# Patient Record
Sex: Female | Born: 1950 | Race: Black or African American | Hispanic: No | Marital: Single | Smoking: Never smoker
Health system: Southern US, Community
[De-identification: ages and names within clinical notes are randomized; demographics above are authoritative.]

## PROBLEM LIST (undated history)

## (undated) DIAGNOSIS — E119 Type 2 diabetes mellitus without complications: Secondary | ICD-10-CM

## (undated) DIAGNOSIS — S0990XA Unspecified injury of head, initial encounter: Secondary | ICD-10-CM

## (undated) DIAGNOSIS — I10 Essential (primary) hypertension: Secondary | ICD-10-CM

## (undated) HISTORY — PX: BRAIN SURGERY: SHX531

---
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
## 2004-09-28 ENCOUNTER — Ambulatory Visit: Payer: Self-pay | Admitting: Family Medicine

## 2004-10-05 ENCOUNTER — Ambulatory Visit: Payer: Self-pay | Admitting: Family Medicine

## 2004-12-22 ENCOUNTER — Emergency Department: Payer: Self-pay | Admitting: Emergency Medicine

## 2006-01-16 ENCOUNTER — Ambulatory Visit: Payer: Self-pay | Admitting: Family Medicine

## 2007-01-20 ENCOUNTER — Ambulatory Visit: Payer: Self-pay | Admitting: Family Medicine

## 2008-02-26 ENCOUNTER — Ambulatory Visit: Payer: Self-pay | Admitting: Family Medicine

## 2008-06-29 ENCOUNTER — Ambulatory Visit: Payer: Self-pay | Admitting: Gastroenterology

## 2009-02-28 ENCOUNTER — Ambulatory Visit: Payer: Self-pay | Admitting: Family Medicine

## 2010-04-12 ENCOUNTER — Ambulatory Visit: Payer: Self-pay | Admitting: Family Medicine

## 2011-05-23 ENCOUNTER — Ambulatory Visit: Payer: Self-pay | Admitting: Family Medicine

## 2012-06-30 ENCOUNTER — Ambulatory Visit: Payer: Self-pay | Admitting: Family Medicine

## 2013-07-01 ENCOUNTER — Ambulatory Visit: Payer: Self-pay | Admitting: Family Medicine

## 2013-10-30 ENCOUNTER — Emergency Department: Payer: Self-pay | Admitting: Emergency Medicine

## 2013-10-30 LAB — CBC
HCT: 36.3 % (ref 35.0–47.0)
HGB: 11.9 g/dL — ABNORMAL LOW (ref 12.0–16.0)
MCH: 29.4 pg (ref 26.0–34.0)
MCHC: 32.9 g/dL (ref 32.0–36.0)
MCV: 89 fL (ref 80–100)
PLATELETS: 297 10*3/uL (ref 150–440)
RBC: 4.06 10*6/uL (ref 3.80–5.20)
RDW: 13.6 % (ref 11.5–14.5)
WBC: 10 10*3/uL (ref 3.6–11.0)

## 2013-10-30 LAB — BASIC METABOLIC PANEL
Anion Gap: 8 (ref 7–16)
BUN: 13 mg/dL (ref 7–18)
Calcium, Total: 10.1 mg/dL (ref 8.5–10.1)
Chloride: 105 mmol/L (ref 98–107)
Co2: 28 mmol/L (ref 21–32)
Creatinine: 0.89 mg/dL (ref 0.60–1.30)
EGFR (African American): >60
Glucose: 106 mg/dL — ABNORMAL HIGH (ref 65–99)
OSMOLALITY: 282 (ref 275–301)
POTASSIUM: 3.2 mmol/L — AB (ref 3.5–5.1)
SODIUM: 141 mmol/L (ref 136–145)

## 2013-10-30 LAB — TROPONIN I

## 2014-02-14 ENCOUNTER — Emergency Department: Payer: Self-pay | Admitting: Emergency Medicine

## 2014-02-14 LAB — BASIC METABOLIC PANEL
ANION GAP: 13 (ref 7–16)
BUN: 16 mg/dL (ref 7–18)
CALCIUM: 9.4 mg/dL (ref 8.5–10.1)
Chloride: 105 mmol/L (ref 98–107)
Co2: 24 mmol/L (ref 21–32)
Creatinine: 1.06 mg/dL (ref 0.60–1.30)
EGFR (Non-African Amer.): 55 — ABNORMAL LOW
GLUCOSE: 142 mg/dL — AB (ref 65–99)
Osmolality: 287 (ref 275–301)
Potassium: 3.3 mmol/L — ABNORMAL LOW (ref 3.5–5.1)
SODIUM: 142 mmol/L (ref 136–145)

## 2014-02-14 LAB — CBC
HCT: 40.8 % (ref 35.0–47.0)
HGB: 12.9 g/dL (ref 12.0–16.0)
MCH: 28.7 pg (ref 26.0–34.0)
MCHC: 31.5 g/dL — AB (ref 32.0–36.0)
MCV: 91 fL (ref 80–100)
Platelet: 317 10*3/uL (ref 150–440)
RBC: 4.47 10*6/uL (ref 3.80–5.20)
RDW: 13.5 % (ref 11.5–14.5)
WBC: 8 10*3/uL (ref 3.6–11.0)

## 2014-02-14 LAB — TROPONIN I: Troponin-I: <0.02 ng/mL

## 2014-07-20 ENCOUNTER — Ambulatory Visit: Payer: Self-pay | Admitting: Family Medicine

## 2015-06-07 ENCOUNTER — Encounter: Payer: Self-pay | Admitting: Emergency Medicine

## 2015-06-07 ENCOUNTER — Emergency Department: Payer: Medicare HMO

## 2015-06-07 ENCOUNTER — Emergency Department
Admission: EM | Admit: 2015-06-07 | Discharge: 2015-06-07 | Disposition: A | Discharge status: Home / Self Care | Payer: Medicare HMO | Attending: Emergency Medicine | Admitting: Emergency Medicine

## 2015-06-07 DIAGNOSIS — S01112A Laceration without foreign body of left eyelid and periocular area, initial encounter: Secondary | ICD-10-CM | POA: Insufficient documentation

## 2015-06-07 DIAGNOSIS — Y9289 Other specified places as the place of occurrence of the external cause: Secondary | ICD-10-CM | POA: Diagnosis not present

## 2015-06-07 DIAGNOSIS — R270 Ataxia, unspecified: Secondary | ICD-10-CM | POA: Diagnosis not present

## 2015-06-07 DIAGNOSIS — S0181XA Laceration without foreign body of other part of head, initial encounter: Secondary | ICD-10-CM

## 2015-06-07 DIAGNOSIS — Z23 Encounter for immunization: Secondary | ICD-10-CM | POA: Insufficient documentation

## 2015-06-07 DIAGNOSIS — S0990XA Unspecified injury of head, initial encounter: Secondary | ICD-10-CM | POA: Diagnosis present

## 2015-06-07 DIAGNOSIS — Y998 Other external cause status: Secondary | ICD-10-CM | POA: Insufficient documentation

## 2015-06-07 DIAGNOSIS — Y9389 Activity, other specified: Secondary | ICD-10-CM | POA: Diagnosis not present

## 2015-06-07 DIAGNOSIS — W01198A Fall on same level from slipping, tripping and stumbling with subsequent striking against other object, initial encounter: Secondary | ICD-10-CM | POA: Insufficient documentation

## 2015-06-07 DIAGNOSIS — S0003XA Contusion of scalp, initial encounter: Secondary | ICD-10-CM

## 2015-06-07 HISTORY — DX: Unspecified injury of head, initial encounter: S09.90XA

## 2015-06-07 MED ORDER — TETANUS-DIPHTH-ACELL PERTUSSIS 5-2.5-18.5 LF-MCG/0.5 IM SUSP
0.5000 mL | Freq: Once | INTRAMUSCULAR | Status: AC
Start: 2015-06-07 — End: 2015-06-07
  Administered 2015-06-07: 0.5 mL via INTRAMUSCULAR
  Filled 2015-06-07 (×2): qty 0.5

## 2015-06-07 MED ORDER — LIDOCAINE-EPINEPHRINE (PF) 1 %-1:200000 IJ SOLN
INTRAMUSCULAR | Status: AC
  Administered 2015-06-07: 19:00:00
  Filled 2015-06-07: qty 30

## 2015-06-07 MED ORDER — LIDOCAINE-EPINEPHRINE (PF) 2 %-1:200000 IJ SOLN: 20.0000 mL | Freq: Once | INTRAMUSCULAR | Status: DC

## 2015-06-07 NOTE — Discharge Instructions (Signed)
Facial Laceration ° A facial laceration is a cut on the face. These injuries can be painful and cause bleeding. Lacerations usually heal quickly, but they need special care to reduce scarring. °DIAGNOSIS  °Your health care provider will take a medical history, ask for details about how the injury occurred, and examine the wound to determine how deep the cut is. °TREATMENT  °Some facial lacerations may not require closure. Others may not be able to be closed because of an increased risk of infection. The risk of infection and the chance for successful closure will depend on various factors, including the amount of time since the injury occurred. °The wound may be cleaned to help prevent infection. If closure is appropriate, pain medicines may be given if needed. Your health care provider will use stitches (sutures), wound glue (adhesive), or skin adhesive strips to repair the laceration. These tools bring the skin edges together to allow for faster healing and a better cosmetic outcome. If needed, you may also be given a tetanus shot. °HOME CARE INSTRUCTIONS °· Only take over-the-counter or prescription medicines as directed by your health care provider. °· Follow your health care provider's instructions for wound care. These instructions will vary depending on the technique used for closing the wound. °For Sutures: °· Keep the wound clean and dry.   °· If you were given a bandage (dressing), you should change it at least once a day. Also change the dressing if it becomes wet or dirty, or as directed by your health care provider.   °· Wash the wound with soap and water 2 times a day. Rinse the wound off with water to remove all soap. Pat the wound dry with a clean towel.   °· After cleaning, apply a thin layer of the antibiotic ointment recommended by your health care provider. This will help prevent infection and keep the dressing from sticking.   °· You may shower as usual after the first 24 hours. Do not soak the  wound in water until the sutures are removed.   °· Get your sutures removed as directed by your health care provider. With facial lacerations, sutures should usually be taken out after 4-5 days to avoid stitch marks.   °· Wait a few days after your sutures are removed before applying any makeup. °For Skin Adhesive Strips: °· Keep the wound clean and dry.   °· Do not get the skin adhesive strips wet. You may bathe carefully, using caution to keep the wound dry.   °· If the wound gets wet, pat it dry with a clean towel.   °· Skin adhesive strips will fall off on their own. You may trim the strips as the wound heals. Do not remove skin adhesive strips that are still stuck to the wound. They will fall off in time.   °For Wound Adhesive: °· You may briefly wet your wound in the shower or bath. Do not soak or scrub the wound. Do not swim. Avoid periods of heavy sweating until the skin adhesive has fallen off on its own. After showering or bathing, gently pat the wound dry with a clean towel.   °· Do not apply liquid medicine, cream medicine, ointment medicine, or makeup to your wound while the skin adhesive is in place. This may loosen the film before your wound is healed.   °· If a dressing is placed over the wound, be careful not to apply tape directly over the skin adhesive. This may cause the adhesive to be pulled off before the wound is healed.   °· Avoid   prolonged exposure to sunlight or tanning lamps while the skin adhesive is in place. °· The skin adhesive will usually remain in place for 5-10 days, then naturally fall off the skin. Do not pick at the adhesive film.   °After Healing: °Once the wound has healed, cover the wound with sunscreen during the day for 1 full year. This can help minimize scarring. Exposure to ultraviolet light in the first year will darken the scar. It can take 1-2 years for the scar to lose its redness and to heal completely.  °SEEK MEDICAL CARE IF: °· You have a fever. °SEEK IMMEDIATE  MEDICAL CARE IF: °· You have redness, pain, or swelling around the wound.   °· You see a yellowish-white fluid (pus) coming from the wound.   °  °This information is not intended to replace advice given to you by your health care provider. Make sure you discuss any questions you have with your health care provider. °  °Document Released: 05/24/2004 Document Revised: 05/07/2014 Document Reviewed: 11/27/2012 °Elsevier Interactive Patient Education ©2016 [employer] ° °Sutured Wound Care °Sutures are stitches that can be used to close wounds. Taking care of your wound properly can help to prevent pain and infection. It can also help your wound to heal more quickly. °HOW TO CARE FOR YOUR SUTURED WOUND °Wound Care °· Keep the wound clean and dry. °· If you were given a bandage (dressing), you should change it at least once per day or as directed by your health care provider. You should also change it if it becomes wet or dirty. °· Keep the wound completely dry for the first 24 hours or as directed by your health care provider. After that time, you may shower or bathe. However, make sure that the wound is not soaked in water until the sutures have been removed. °· Clean the wound one time each day or as directed by your health care provider. °¨ Wash the wound with soap and water. °¨ Rinse the wound with water to remove all soap. °¨ Pat the wound dry with a clean towel. Do not rub the wound. °· After cleaning the wound, apply a thin layer of antibiotic ointment as directed by your health care provider. This will help to prevent infection and keep the dressing from sticking to the wound. °· Have the sutures removed as directed by your health care provider. °General Instructions °· Take or apply medicines only as directed by your health care provider. °· To help prevent scarring, make sure to cover your wound with sunscreen whenever you are outside after the sutures are removed and the wound is healed. Make sure to wear a  sunscreen of at least 30 SPF. °· If you were prescribed an antibiotic medicine or ointment, finish all of it even if you start to feel better. °· Do not scratch or pick at the wound. °· Keep all follow-up visits as directed by your health care provider. This is important. °· Check your wound every day for signs of infection. Watch for:   °¨ Redness, swelling, or pain. °¨ Fluid, blood, or pus. °· Raise (elevate) the injured area above the level of your heart while you are sitting or lying down, if possible. °· Avoid stretching your wound. °· Drink enough fluids to keep your urine clear or pale yellow. °SEEK MEDICAL CARE IF: °· You received a tetanus shot and you have swelling, severe pain, redness, or bleeding at the injection site. °· You have a fever. °· A wound that was closed breaks open. °·   You notice a bad smell coming from the wound. °· You notice something coming out of the wound, such as wood or glass. °· Your pain is not controlled with medicine. °· You have increased redness, swelling, or pain at the site of your wound. °· You have fluid, blood, or pus coming from your wound. °· You notice a change in the color of your skin near your wound. °· You need to change the dressing frequently due to fluid, blood, or pus draining from the wound. °· You develop a new rash. °· You develop numbness around the wound. °SEEK IMMEDIATE MEDICAL CARE IF: °· You develop severe swelling around the injury site. °· Your pain suddenly increases and is severe. °· You develop painful lumps near the wound or on skin that is anywhere on your body. °· You have a red streak going away from your wound. °· The wound is on your hand or foot and you cannot properly move a finger or toe. °· The wound is on your hand or foot and you notice that your fingers or toes look pale or bluish. °  °This information is not intended to replace advice given to you by your health care provider. Make sure you discuss any questions you have with your  health care provider. °  °Document Released: 05/24/2004 Document Revised: 08/31/2014 Document Reviewed: 11/26/2012 °Elsevier Interactive Patient Education ©2016 [employer] ° °

## 2015-06-07 NOTE — ED Provider Notes (Signed)
[facility] Emergency Department Provider Note  ____________________________________________  Time seen: 7:00 PM  I have reviewed the triage vital signs and the nursing notes.   HISTORY  Chief Complaint Fall and Head Injury    HPI Ebony Guzman is a 65 y.o. female who is brought to the ED due to facial trauma. Due to prior head injury from when the patient was 16 the results of an craniectomy, the patient has chronic ataxia. There've been no change in her symptoms or recent illness, but she does normally have trouble walking and falls frequently. They when she fell, she had the left-sided face against a table causing lacerations and swelling of the left eye. Denies any eye pain or change in vision although she is not currently wearing her glasses. No other acute symptoms. No neck pain.     Past Medical History  Diagnosis Date  . Head injury      There are no active problems to display for this patient.    Past Surgical History  Procedure Laterality Date  . Brain surgery      previous head injury with ongoing deficits, patient was 16.  had craniectomy  . Craniectomy  1968     No current outpatient prescriptions on file.   Allergies Review of patient's allergies indicates no known allergies.   No family history on file.  Social History Social History  Substance Use Topics  . Smoking status: Never Smoker   . Smokeless tobacco: None  . Alcohol Use: No    Review of Systems  Constitutional:   No fever or chills. No weight changes Eyes:   No blurry vision or double vision.  ENT:   No sore throat. Cardiovascular:   No chest pain. Respiratory:   No dyspnea or cough. Gastrointestinal:   Negative for abdominal pain, vomiting and diarrhea.  No BRBPR or melena. Genitourinary:   Negative for dysuria, urinary retention, bloody urine, or difficulty urinating. Musculoskeletal:   Negative for back pain. No joint swelling or pain. Skin:    Negative for rash. Neurological:   Positive left forehead pain. No focal weakness or numbness. Chronic ataxia.Marland Kitchen Psychiatric:  No anxiety or depression.   Endocrine:  No hot/cold intolerance, changes in energy, or sleep difficulty.  10-point ROS otherwise negative.  ____________________________________________   PHYSICAL EXAM:  VITAL SIGNS: ED Triage Vitals  Enc Vitals Group     BP 06/07/15 1750 164/77 mmHg     Pulse Rate 06/07/15 1750 82     Resp 06/07/15 1750 18     Temp 06/07/15 1750 97.8 F (36.6 C)     Temp Source 06/07/15 1750 Oral     SpO2 06/07/15 1750 100 %     Weight 06/07/15 1750 205 lb (92.987 kg)     Height 06/07/15 1750  (1.626 m)     Head Cir --      Peak Flow --      Pain Score --      Pain Loc --      Pain Edu? --      Excl. in GC? --     Vital signs reviewed, nursing assessments reviewed.   Constitutional:   Alert and oriented. Well appearing and in no distress. Eyes:   No scleral icterus. No conjunctival pallor. PERRL. EOMI and painless ENT   Head:   Normocephalic with left forehead scalp hematoma. There is a 1.5 cm linear laceration through the left eyebrow and a 2 cm curvilinear laceration  through the left upper eyelid.. No debris or contamination   Nose:   No congestion/rhinnorhea. No septal hematoma   Mouth/Throat:   MMM, no pharyngeal erythema. No peritonsillar mass. No uvula shift.   Neck:   No stridor. No SubQ emphysema. No meningismus. No midline tenderness Hematological/Lymphatic/Immunilogical:   No cervical lymphadenopathy. Musculoskeletal:   Nontender with normal range of motion in all extremities. No joint effusions.  No lower extremity tenderness.  No edema. Neurologic:   Normal speech and language.  CN 2-10 normal. Motor grossly intact. No pronator drift.  Ataxic gait, at baseline according to family at the bedside who care for the patient daily.. No acute focal neurologic deficits are appreciated.  Skin:    Skin is  warm, dry and intact. No rash noted.  No petechiae, purpura, or bullae. Psychiatric:   Mood and affect are normal. Speech and behavior are normal. Patient exhibits appropriate insight and judgment.  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 No acute skull fracture or intracranial hemorrhage or other acute findings.  ____________________________________________   PROCEDURES LACERATION REPAIR Performed by: Sharman Cheek Authorized by: Sharman Cheek Consent: Verbal consent obtained. Risks and benefits: risks, benefits and alternatives were discussed Consent given by: patient Patient identity confirmed: provided demographic data Prepped and Draped in normal sterile fashion Wound explored  Laceration Location: Left eyebrow  Laceration Length: 1.5 cm  No Foreign Bodies seen or palpated  Anesthesia: local infiltration  Local anesthetic: lidocaine 1% with epinephrine  Anesthetic total: 1 ml  Irrigation method: syringe Amount of cleaning: standard  Skin closure: 4-0 Monocryl   Number of sutures: 2  Technique: Simple interrupted   Patient tolerance: Patient tolerated the procedure well with no immediate complications.    LACERATION REPAIR Performed by: Sharman Cheek Authorized by: Sharman Cheek Consent: Verbal consent obtained. Risks and benefits: risks, benefits and alternatives were discussed Consent given by: patient Patient identity confirmed: provided demographic data Prepped and Draped in normal sterile fashion Wound explored  Laceration Location: Left upper eyelid  Laceration Length: 2cm  No  Foreign Bodies seen or palpated  Anesthesia: local infiltration  Local anesthetic: lidocaine 1% with epinephrine  Anesthetic total: 1 ml  Irrigation method: syringe Amount of cleaning: standard  Skin closure: 5-0 Monocryl   Number of sutures: 2  Technique: Simple interrupted   Patient tolerance: Patient tolerated the procedure well with no immediate complications.   ____________________________________________   INITIAL IMPRESSION / ASSESSMENT AND PLAN / ED COURSE  Pertinent labs & imaging results that were available during my care of the patient were reviewed by me and considered in my medical decision making (see chart for details).  Patient presents with 2 small facial lacerations from blunt head trauma related to chronic ataxia. No other acute symptoms or findings. She is otherwise at her neurologic baseline. We'll discharge home with family members. Wounds cleaned and sutured for cosmesis and improved healing. Hemostatic without complications.     ____________________________________________   FINAL CLINICAL IMPRESSION(S) / ED DIAGNOSES  Final diagnoses:  Facial laceration, initial encounter  Scalp hematoma, initial encounter      Sharman Cheek, MD 06/07/15 [phone]

## 2015-06-07 NOTE — ED Notes (Signed)
Visual acuity was unable to be performed. Patient didn't bring her prescription glasses with her and her left eye is swollen closed.

## 2015-06-07 NOTE — ED Notes (Addendum)
Mechanical fall, lost balance and hit face on corner of table.  No blood thinners.    Laceration/hematoma to left brow, surgical scar (very old) from previous craniectomy from head trauma when she was 52.  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  Mechanical fall due to unsteady gait/ataxia r/t old head injury.  Takes 81 mg asa daily.  Has baseline confusion about date and situations.  Mother states patient can state her date of birth.

## 2015-06-10 ENCOUNTER — Other Ambulatory Visit: Payer: Self-pay | Admitting: Family Medicine

## 2015-06-10 DIAGNOSIS — R918 Other nonspecific abnormal finding of lung field: Secondary | ICD-10-CM

## 2015-06-20 ENCOUNTER — Ambulatory Visit: Admission: RE | Admit: 2015-06-20 | Payer: Medicare HMO | Source: Ambulatory Visit

## 2015-06-27 ENCOUNTER — Ambulatory Visit: Payer: Medicare HMO | Attending: Family Medicine

## 2016-04-30 ENCOUNTER — Emergency Department
Admission: EM | Admit: 2016-04-30 | Discharge: 2016-04-30 | Disposition: A | Discharge status: Home / Self Care | Payer: Medicare (Managed Care) | Attending: Emergency Medicine | Admitting: Emergency Medicine

## 2016-04-30 ENCOUNTER — Emergency Department: Payer: Medicare (Managed Care)

## 2016-04-30 ENCOUNTER — Encounter: Payer: Self-pay | Admitting: Emergency Medicine

## 2016-04-30 DIAGNOSIS — Y9389 Activity, other specified: Secondary | ICD-10-CM | POA: Diagnosis not present

## 2016-04-30 DIAGNOSIS — W108XXA Fall (on) (from) other stairs and steps, initial encounter: Secondary | ICD-10-CM | POA: Insufficient documentation

## 2016-04-30 DIAGNOSIS — S0003XA Contusion of scalp, initial encounter: Secondary | ICD-10-CM | POA: Insufficient documentation

## 2016-04-30 DIAGNOSIS — Y999 Unspecified external cause status: Secondary | ICD-10-CM | POA: Diagnosis not present

## 2016-04-30 DIAGNOSIS — S0990XA Unspecified injury of head, initial encounter: Secondary | ICD-10-CM

## 2016-04-30 DIAGNOSIS — Y929 Unspecified place or not applicable: Secondary | ICD-10-CM | POA: Diagnosis not present

## 2016-04-30 DIAGNOSIS — W19XXXA Unspecified fall, initial encounter: Secondary | ICD-10-CM

## 2016-04-30 NOTE — ED Provider Notes (Signed)
[facility] Emergency Department Provider Note  ____________________________________________   I have reviewed the triage vital signs and the nursing notes.   HISTORY  Chief Complaint Fall    HPI Ebony Guzman is a 66 y.o. 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 She is at her baseline today that she tripped while going on a carpeted stair and bumped her head. Did not pass out. No other symptoms before after. Feels back to baseline. No complaints of pain anywhere else. Has a hematoma to the occipital region.   Past Medical History:  Diagnosis Date  . Head injury     There are no active problems to display for this patient.   Past Surgical History:  Procedure Laterality Date  . BRAIN SURGERY     previous head injury with ongoing deficits, patient was 39.  had craniectomy  . CRANIECTOMY  1968    Prior to Admission medications   Not on File    Allergies Patient has no known allergies.  History reviewed. No pertinent family history.  Social History Social History  Substance Use Topics  . Smoking status: Never Smoker  . Smokeless tobacco: Never Used  . Alcohol use No    Review of Systems }Constitutional: No fever/chills Eyes: No visual changes. ENT: No sore throat. No stiff neck no neck pain Cardiovascular: Denies chest pain. Respiratory: Denies shortness of breath. Gastrointestinal:   no vomiting.  No diarrhea.  No constipation. Genitourinary: Negative for dysuria. Musculoskeletal: Negative lower extremity swelling Skin: Negative for rash. Neurological: Negative for severe headaches, focal weakness or numbness. 10-point ROS otherwise negative.  ____________________________________________   PHYSICAL EXAM:  VITAL SIGNS: ED Triage Vitals [04/30/16 1724]  Enc Vitals Group     BP      Pulse Rate 75     Resp      Temp 98.4 F (36.9 C)     Temp Source Oral      SpO2 100 %     Weight 199 lb (90.3 kg)     Height 5\' 5"  (1.651 m)     Head Circumference      Peak Flow      Pain Score      Pain Loc      Pain Edu?      Excl. in GC?     Constitutional: Alert and oriented to name and place, she knew the year,t. Well appearing and in no acute distress. Eyes: Conjunctivae are normal. PERRL. EOMI. Head: Hematoma with a slight abrasion noted to the occiput with no skull fracture palpated. Nose: No congestion/rhinnorhea. Mouth/Throat: Mucous membranes are moist.  Oropharynx non-erythematous. Neck: No stridor.   Nontender with no meningismus Cardiovascular: Normal rate, regular rhythm. Grossly normal heart sounds.  Good peripheral circulation. Respiratory: Normal respiratory effort.  No retractions. Lungs CTAB. Abdominal: Soft and nontender. No distention. No guarding no rebound Back:  There is no focal tenderness or step off.  there is no midline tenderness there are no lesions noted. there is no CVA tenderness Musculoskeletal: No lower extremity tenderness, no upper extremity tenderness. No joint effusions, no DVT signs strong distal pulses no edema Neurologic:  Normal speech and language. No gross focal neurologic deficits are appreciated.  Skin:  Skin is warm, dry and intact. No rash noted. Psychiatric: Mood and affect are normal. Speech and behavior are normal.  ____________________________________________   LABS (all labs ordered are listed, but  only abnormal results are displayed)  Labs Reviewed - No data to display ____________________________________________  EKG  I personally interpreted any EKGs ordered by me or triage  ____________________________________________  RADIOLOGY  I reviewed any imaging ordered by me or triage that were performed during my shift and, if possible, patient and/or family made aware of any abnormal findings. ____________________________________________   PROCEDURES  Procedure(s) performed:  None  Procedures  Critical Care performed: None  ____________________________________________   INITIAL IMPRESSION / ASSESSMENT AND PLAN / ED COURSE  Pertinent labs & imaging results that were available during my care of the patient were reviewed by me and considered in my medical decision making (see chart for details).  Patient at neurologic baseline CT shows no evidence of bleed or other significant injury, she has no evidence of neck or hip fracture at this time, we will discharge her home. This is a non-syncopal fall in a patient with poor balance was not using her walker. Family very comfortable with this. They do feel that she is at her baseline. Clinical Course    ____________________________________________   FINAL CLINICAL IMPRESSION(S) / ED DIAGNOSES  Final diagnoses:  None      This chart was dictated using voice recognition software.  Despite best efforts to proofread,  errors can occur which can change meaning.      Jeanmarie PlantJames A Gaetano Romberger, MD 04/30/16 [phone]

## 2016-04-30 NOTE — ED Triage Notes (Addendum)
Pt ems from home s/p fall. Pt tripped and fell down  concrete ramp. Pt has small non-bleeding lac posterior head. 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 Per ems, family stated that pt is at baseline. Per pt she is at baseline. Pt denies pain.

## 2016-09-14 ENCOUNTER — Other Ambulatory Visit: Payer: Self-pay | Admitting: Family

## 2016-09-14 DIAGNOSIS — R918 Other nonspecific abnormal finding of lung field: Secondary | ICD-10-CM

## 2016-10-04 ENCOUNTER — Other Ambulatory Visit: Payer: Self-pay | Admitting: Family

## 2016-10-04 DIAGNOSIS — Z1231 Encounter for screening mammogram for malignant neoplasm of breast: Secondary | ICD-10-CM

## 2016-10-05 ENCOUNTER — Ambulatory Visit
Admission: RE | Admit: 2016-10-05 | Discharge: 2016-10-05 | Disposition: A | Discharge status: Home / Self Care | Payer: Medicare (Managed Care) | Source: Ambulatory Visit | Attending: Family | Admitting: Family

## 2016-10-05 DIAGNOSIS — R918 Other nonspecific abnormal finding of lung field: Secondary | ICD-10-CM | POA: Diagnosis not present

## 2016-11-14 ENCOUNTER — Ambulatory Visit
Admission: RE | Admit: 2016-11-14 | Discharge: 2016-11-14 | Disposition: A | Discharge status: Home / Self Care | Payer: Medicare (Managed Care) | Source: Ambulatory Visit | Attending: Family | Admitting: Family

## 2016-11-14 DIAGNOSIS — Z1231 Encounter for screening mammogram for malignant neoplasm of breast: Secondary | ICD-10-CM | POA: Diagnosis present

## 2019-04-03 ENCOUNTER — Emergency Department: Payer: Medicare (Managed Care)

## 2019-04-03 ENCOUNTER — Other Ambulatory Visit: Payer: Self-pay

## 2019-04-03 ENCOUNTER — Inpatient Hospital Stay
Admission: EM | Admit: 2019-04-03 | Discharge: 2019-04-05 | DRG: 542 | Disposition: A | Discharge status: Home Health | Payer: Medicare (Managed Care) | Attending: Internal Medicine | Admitting: Internal Medicine

## 2019-04-03 DIAGNOSIS — J1282 Pneumonia due to coronavirus disease 2019: Secondary | ICD-10-CM

## 2019-04-03 DIAGNOSIS — R778 Other specified abnormalities of plasma proteins: Secondary | ICD-10-CM | POA: Diagnosis present

## 2019-04-03 DIAGNOSIS — I10 Essential (primary) hypertension: Secondary | ICD-10-CM

## 2019-04-03 DIAGNOSIS — E119 Type 2 diabetes mellitus without complications: Secondary | ICD-10-CM

## 2019-04-03 DIAGNOSIS — W1830XA Fall on same level, unspecified, initial encounter: Secondary | ICD-10-CM | POA: Diagnosis present

## 2019-04-03 DIAGNOSIS — S069X9S Unspecified intracranial injury with loss of consciousness of unspecified duration, sequela: Secondary | ICD-10-CM

## 2019-04-03 DIAGNOSIS — S32591A Other specified fracture of right pubis, initial encounter for closed fracture: Secondary | ICD-10-CM | POA: Diagnosis not present

## 2019-04-03 DIAGNOSIS — Z803 Family history of malignant neoplasm of breast: Secondary | ICD-10-CM

## 2019-04-03 DIAGNOSIS — S069X9A Unspecified intracranial injury with loss of consciousness of unspecified duration, initial encounter: Secondary | ICD-10-CM | POA: Diagnosis present

## 2019-04-03 DIAGNOSIS — U071 COVID-19: Secondary | ICD-10-CM

## 2019-04-03 DIAGNOSIS — S329XXA Fracture of unspecified parts of lumbosacral spine and pelvis, initial encounter for closed fracture: Secondary | ICD-10-CM | POA: Diagnosis present

## 2019-04-03 DIAGNOSIS — Z23 Encounter for immunization: Secondary | ICD-10-CM

## 2019-04-03 DIAGNOSIS — J1289 Other viral pneumonia: Secondary | ICD-10-CM

## 2019-04-03 DIAGNOSIS — M84454A Pathological fracture, pelvis, initial encounter for fracture: Principal | ICD-10-CM | POA: Diagnosis present

## 2019-04-03 DIAGNOSIS — Y92019 Unspecified place in single-family (private) house as the place of occurrence of the external cause: Secondary | ICD-10-CM

## 2019-04-03 DIAGNOSIS — Z87820 Personal history of traumatic brain injury: Secondary | ICD-10-CM

## 2019-04-03 DIAGNOSIS — Z801 Family history of malignant neoplasm of trachea, bronchus and lung: Secondary | ICD-10-CM

## 2019-04-03 DIAGNOSIS — S069XAA Unspecified intracranial injury with loss of consciousness status unknown, initial encounter: Secondary | ICD-10-CM | POA: Diagnosis present

## 2019-04-03 HISTORY — DX: Essential (primary) hypertension: I10

## 2019-04-03 HISTORY — DX: Type 2 diabetes mellitus without complications: E11.9

## 2019-04-03 LAB — CBC WITH DIFFERENTIAL/PLATELET
Abs Immature Granulocytes: 0.03 10*3/uL (ref 0.00–0.07)
Basophils Absolute: 0 10*3/uL (ref 0.0–0.1)
Basophils Relative: 0 %
Eosinophils Absolute: 0 10*3/uL (ref 0.0–0.5)
Eosinophils Relative: 0 %
HCT: 35.4 % — ABNORMAL LOW (ref 36.0–46.0)
Hemoglobin: 11.9 g/dL — ABNORMAL LOW (ref 12.0–15.0)
Immature Granulocytes: 1 %
Lymphocytes Relative: 18 %
Lymphs Abs: 1.2 10*3/uL (ref 0.7–4.0)
MCH: 29.2 pg (ref 26.0–34.0)
MCHC: 33.6 g/dL (ref 30.0–36.0)
MCV: 86.8 fL (ref 80.0–100.0)
Monocytes Absolute: 0.3 10*3/uL (ref 0.1–1.0)
Monocytes Relative: 4 %
Neutro Abs: 5.1 10*3/uL (ref 1.7–7.7)
Neutrophils Relative %: 77 %
Platelets: 193 10*3/uL (ref 150–400)
RBC: 4.08 MIL/uL (ref 3.87–5.11)
RDW: 13.5 % (ref 11.5–15.5)
Smear Review: NORMAL
WBC: 6.6 10*3/uL (ref 4.0–10.5)
nRBC: 0 % (ref 0.0–0.2)

## 2019-04-03 LAB — URINALYSIS, COMPLETE (UACMP) WITH MICROSCOPIC
Bacteria, UA: NONE SEEN
Bilirubin Urine: NEGATIVE
Glucose, UA: NEGATIVE mg/dL
Hgb urine dipstick: NEGATIVE
Ketones, ur: 5 mg/dL — AB
Leukocytes,Ua: NEGATIVE
Nitrite: NEGATIVE
Protein, ur: 100 mg/dL — AB
Specific Gravity, Urine: 1.018 (ref 1.005–1.030)
pH: 6 (ref 5.0–8.0)

## 2019-04-03 LAB — COMPREHENSIVE METABOLIC PANEL
ALT: 28 U/L (ref 0–44)
AST: 76 U/L — ABNORMAL HIGH (ref 15–41)
Albumin: 3.3 g/dL — ABNORMAL LOW (ref 3.5–5.0)
Alkaline Phosphatase: 36 U/L — ABNORMAL LOW (ref 38–126)
Anion gap: 10 (ref 5–15)
BUN: 12 mg/dL (ref 8–23)
CO2: 24 mmol/L (ref 22–32)
Calcium: 8.6 mg/dL — ABNORMAL LOW (ref 8.9–10.3)
Chloride: 102 mmol/L (ref 98–111)
Creatinine, Ser: 0.8 mg/dL (ref 0.44–1.00)
GFR calc Af Amer: >60 mL/min (ref >60)
GFR calc non Af Amer: >60 mL/min (ref >60)
Glucose, Bld: 137 mg/dL — ABNORMAL HIGH (ref 70–99)
Potassium: 3.8 mmol/L (ref 3.5–5.1)
Sodium: 136 mmol/L (ref 135–145)
Total Bilirubin: 0.4 mg/dL (ref 0.3–1.2)
Total Protein: 7.2 g/dL (ref 6.5–8.1)

## 2019-04-03 LAB — TROPONIN I (HIGH SENSITIVITY)
Troponin I (High Sensitivity): 86 ng/L — ABNORMAL HIGH (ref <18)
Troponin I (High Sensitivity): 67 ng/L — ABNORMAL HIGH (ref <18)

## 2019-04-03 LAB — PROCALCITONIN: Procalcitonin: <0.1 ng/mL

## 2019-04-03 MED ORDER — ENOXAPARIN SODIUM 40 MG/0.4ML ~~LOC~~ SOLN
40.0000 mg | SUBCUTANEOUS | Status: DC
  Administered 2019-04-03 – 2019-04-04 (×2): 40 mg via SUBCUTANEOUS
  Filled 2019-04-03 (×2): qty 0.4

## 2019-04-03 MED ORDER — ACETAMINOPHEN 500 MG PO TABS
1000.0000 mg | ORAL_TABLET | Freq: Three times a day (TID) | ORAL | Status: DC
  Administered 2019-04-03 – 2019-04-05 (×6): 1000 mg via ORAL
  Filled 2019-04-03 (×7): qty 2

## 2019-04-03 MED ORDER — HYDROCOD POLST-CPM POLST ER 10-8 MG/5ML PO SUER: 5.0000 mL | Freq: Two times a day (BID) | ORAL | Status: DC | PRN

## 2019-04-03 MED ORDER — VITAMIN C 500 MG PO TABS
500.0000 mg | ORAL_TABLET | Freq: Every day | ORAL | Status: DC
  Administered 2019-04-03 – 2019-04-05 (×3): 500 mg via ORAL
  Filled 2019-04-03 (×3): qty 1

## 2019-04-03 MED ORDER — GUAIFENESIN-DM 100-10 MG/5ML PO SYRP
10.0000 mL | ORAL_SOLUTION | ORAL | Status: DC | PRN
  Filled 2019-04-03: qty 10

## 2019-04-03 MED ORDER — KETOROLAC TROMETHAMINE 15 MG/ML IJ SOLN
15.0000 mg | Freq: Once | INTRAMUSCULAR | Status: AC
  Administered 2019-04-03: 15 mg via INTRAVENOUS
  Filled 2019-04-03: qty 1

## 2019-04-03 MED ORDER — ACETAMINOPHEN 325 MG PO TABS
650.0000 mg | ORAL_TABLET | Freq: Once | ORAL | Status: AC | PRN
  Administered 2019-04-03: 650 mg via ORAL
  Filled 2019-04-03: qty 2

## 2019-04-03 MED ORDER — OXYCODONE HCL 5 MG PO TABS: 5.0000 mg | ORAL_TABLET | ORAL | Status: DC | PRN

## 2019-04-03 MED ORDER — HYDROCODONE-ACETAMINOPHEN 5-325 MG PO TABS
1.0000 | ORAL_TABLET | ORAL | Status: DC | PRN
  Administered 2019-04-03: 1 via ORAL
  Filled 2019-04-03: qty 1

## 2019-04-03 MED ORDER — ACETAMINOPHEN 500 MG PO TABS: 1000.0000 mg | ORAL_TABLET | Freq: Once | ORAL | Status: DC

## 2019-04-03 MED ORDER — ZINC SULFATE 220 (50 ZN) MG PO CAPS
220.0000 mg | ORAL_CAPSULE | Freq: Every day | ORAL | Status: DC
  Administered 2019-04-03 – 2019-04-05 (×3): 220 mg via ORAL
  Filled 2019-04-03 (×4): qty 1

## 2019-04-03 NOTE — H&P (Signed)
History and Physical  Patient Name: Ebony Guzman     TFT:[id number]    DOB: 07/14/1950    DOA: 04/03/2019 PCP: Talbert Cage, FNP  Patient coming from: Home  Chief Complaint: Pelvic pain      HPI: Ebony Guzman is a 68 y.o. F with hx HTN, DM and remote TBI in [facility] age 20 who presents with fall and leg pain.  Patient was in [place] until Monday 5 days ago, developed coryza.  Tested positive for COVID, and was home self-monitoring until this morning, she was walking and had mechanical fall.  After the fall, she was unable to bear weight and had severe pain in the right groin and right leg.  Two people could not lift her to stand.  In the ER, febrile to 101F, HR and RR and SpO2 normal.  CXR showed patchy multifocal pneumonia.  Hip radiograph normal but CT pelvis showed faint inferior pubic ramus fracture, consistent with pain.  Orthopedics recommended pain control and WBAT.           ROS: Review of Systems  Constitutional: Positive for fever. Negative for malaise/fatigue.  HENT: Positive for congestion.   Respiratory: Negative for cough, hemoptysis, sputum production, shortness of breath and wheezing.   Cardiovascular: Negative for chest pain.  Neurological: Positive for weakness and headaches. Negative for dizziness, tingling, tremors, sensory change, speech change, focal weakness, seizures and loss of consciousness.  All other systems reviewed and are negative.         Past Medical History:  Diagnosis Date   Diabetes mellitus without complication ([place])    Head injury    Hypertension     Past Surgical History:  Procedure Laterality Date   BRAIN SURGERY     previous head injury with ongoing deficits, patient was 17.  had craniectomy   CRANIECTOMY  1968    Social History: Patient lives with her niece.  The patient walks with a walker for many years.  Nonsmoker.  No Known Allergies  Family history: family history includes Breast cancer in her  mother; Lung cancer in her father.  Prior to Admission medications   Not on File  Amlodipine lisinopril Crestor      Physical Exam: BP (!) 145/84    Pulse 92    Temp (!) 101.6 F (38.7 C) (Oral)    Resp 16    Ht 5\' 8"  (1.727 m)    Wt 99.8 kg    LMP  (LMP Unknown)    SpO2 99%    BMI 33.45 kg/m  General appearance: Well-developed, eldelry adult female, alert and in no acute distress.   Eyes: Anicteric, conjunctiva pink, lids and lashes normal. PERRL.    ENT: No nasal deformity, discharge, epistaxis.  Hearing normal. OP moist without lesions.   Skin: Warm and dry.  No jaundice.  No suspicious rashes or lesions. Cardiac: RRR, nl S1-S2, no murmurs appreciated.  Capillary refill is brisk.  No JVD.  No LE edema.  Radial pulses 2+ and symmetric. Respiratory: Normal respiratory rate and rhythm.  CTAB without rales or wheezes. Abdomen: Abdomen soft.  No TTP or gaurding. No ascites, distension, hepatosplenomegaly.   MSK: No deformities or effusions of the large joints of the upper or lower extremities bilaterally.  No cyanosis or clubbing. Neuro: Cranial nerves 3-12 intact.  Sensation intact to light touch. Speech is fluent.  Muscle strength 5/5 in arms, slightly spastic bilaterally, legs limited by pain, ankles trength 5/5, pain prevented flexion at hip.  Stutter noted.  Psych: Sensorium intact and responding to questions, attention normal.  Behavior appropriate.  Affect normal.  Judgment and insight appear normal.     Labs on Admission:  I have personally reviewed following labs and imaging studies: CBC: Recent Labs  Lab 04/03/19 1032  WBC 6.6  NEUTROABS 5.1  HGB 11.9*  HCT 35.4*  MCV 86.8  PLT 193   Basic Metabolic Panel: Recent Labs  Lab 04/03/19 1032  NA 136  K 3.8  CL 102  CO2 24  GLUCOSE 137*  BUN 12  CREATININE 0.80  CALCIUM 8.6*   GFR: Estimated Creatinine Clearance: 80.9 mL/min (by C-G formula based on SCr of 0.8 mg/dL).  Liver Function Tests: Recent Labs    Lab 04/03/19 1032  AST 76*  ALT 28  ALKPHOS 36*  BILITOT 0.4  PROT 7.2  ALBUMIN 3.3*          Radiological Exams on Admission: Personally reviewed CXR showed no focal opacity; CT head and c-spine negative, CT pelvis report reviewed: Ct Head Wo Contrast  Result Date: 04/03/2019 CLINICAL DATA:  Unwitnessed trauma this morning status post fall. EXAM: CT HEAD WITHOUT CONTRAST CT CERVICAL SPINE WITHOUT CONTRAST TECHNIQUE: Multidetector CT imaging of the head and cervical spine was performed following the standard protocol without intravenous contrast. Multiplanar CT image reconstructions of the cervical spine were also generated. COMPARISON:  Head CT April 30, 2016 FINDINGS: CT HEAD FINDINGS Brain: No acute territory infarction or intracranial hemorrhage is visualized. No focal mass lesion is identified. Asymmetric left greater than right lateral ex vacuole ventricular enlargement is similar to prior exam. Left hemispheric encephalomalacia and white matter volume loss is similar to prior exam. Stable small old lacunar infarcts are identified in the right periventricular white matter unchanged. Vascular: No hyperdense vessel is noted. Skull: No acute abnormality. Sinuses/Orbits: No acute finding. Other: None. CT CERVICAL SPINE FINDINGS Alignment: Normal. Skull base and vertebrae: No acute fracture. No focal pathologic process is noted. Soft tissues and spinal canal: No prevertebral fluid or swelling. No visible canal hematoma. Disc levels: Extensive anterior osteophytosis is identified throughout the cervical spine. The intervertebral spaces are slightly diminished. Upper chest: Visualized lung apices are clear. Mild generalized enlargement of the thyroid gland is noted. Other: None. IMPRESSION: 1. No focal acute intracranial abnormality identified. 2. No acute fracture or dislocation of cervical spine. Electronically Signed   By: Sherian ReinWei-Chen  Lin M.D.   On: 04/03/2019 10:20   Ct Cervical Spine Wo  Contrast  Result Date: 04/03/2019 CLINICAL DATA:  Unwitnessed trauma this morning status post fall. EXAM: CT HEAD WITHOUT CONTRAST CT CERVICAL SPINE WITHOUT CONTRAST TECHNIQUE: Multidetector CT imaging of the head and cervical spine was performed following the standard protocol without intravenous contrast. Multiplanar CT image reconstructions of the cervical spine were also generated. COMPARISON:  Head CT April 30, 2016 FINDINGS: CT HEAD FINDINGS Brain: No acute territory infarction or intracranial hemorrhage is visualized. No focal mass lesion is identified. Asymmetric left greater than right lateral ex vacuole ventricular enlargement is similar to prior exam. Left hemispheric encephalomalacia and white matter volume loss is similar to prior exam. Stable small old lacunar infarcts are identified in the right periventricular white matter unchanged. Vascular: No hyperdense vessel is noted. Skull: No acute abnormality. Sinuses/Orbits: No acute finding. Other: None. CT CERVICAL SPINE FINDINGS Alignment: Normal. Skull base and vertebrae: No acute fracture. No focal pathologic process is noted. Soft tissues and spinal canal: No prevertebral fluid or swelling. No visible canal hematoma. Disc  levels: Extensive anterior osteophytosis is identified throughout the cervical spine. The intervertebral spaces are slightly diminished. Upper chest: Visualized lung apices are clear. Mild generalized enlargement of the thyroid gland is noted. Other: None. IMPRESSION: 1. No focal acute intracranial abnormality identified. 2. No acute fracture or dislocation of cervical spine. Electronically Signed   By: Sherian Rein M.D.   On: 04/03/2019 10:20   Ct Pelvis Wo Contrast  Result Date: 04/03/2019 CLINICAL DATA:  Right hip pain after fall EXAM: CT PELVIS WITHOUT CONTRAST TECHNIQUE: Multidetector CT imaging of the pelvis was performed following the standard protocol without intravenous contrast. COMPARISON:  x-ray 04/03/2019  FINDINGS: Bones/Joint/Cartilage Very subtle cortical lucency of the medial aspect of the right inferior pubic ramus (series 2, image 91) which could represent a prominent vascular channel versus a nondisplaced fracture. The osseous structures appear otherwise intact. No additional fractures. SI joints and pubic symphysis are intact without diastasis. Bilateral hips are intact without fracture or dislocation. Right greater than left facet arthropathy noted at the L4-5 level. Assimilation joint on the right at L5-S1. No demonstrable hip joint effusion. Ligaments Suboptimally evaluated by CT. Muscles and Tendons Tendinous structures about the pelvis appear grossly intact. No focal muscular abnormality. No right adductor muscle group hematoma. Soft tissues No soft tissue fluid collection or hematoma. No acute intrapelvic abnormality. IMPRESSION: 1. Very subtle cortical lucency of the medial aspect of the right inferior pubic ramus which could represent a prominent vascular channel versus a nondisplaced fracture. The osseous structures appear otherwise intact. 2. No acute findings within the pelvis or surrounding soft tissues. Electronically Signed   By: Duanne Guess M.D.   On: 04/03/2019 14:46   Dg Chest Portable 1 View  Result Date: 04/03/2019 CLINICAL DATA:  COVID-19 positive.  Fall this morning. EXAM: PORTABLE CHEST 1 VIEW COMPARISON:  10/31/2013 chest radiograph. FINDINGS: Stable cardiomediastinal silhouette with normal heart size. No pneumothorax. No pleural effusion. Patchy hazy opacities in the peripheral upper and lower right lung, new. No displaced fractures in the visualized chest. IMPRESSION: New patchy hazy opacities in the peripheral upper lower right lung compatible with multilobar pneumonia. Chest radiograph follow-up advised. Electronically Signed   By: Delbert Phenix M.D.   On: 04/03/2019 10:21   Dg Hip Unilat  With Pelvis 2-3 Views Right  Result Date: 04/03/2019 CLINICAL DATA:  Larey Seat this  morning.  Right hip pain. EXAM: DG HIP (WITH OR WITHOUT PELVIS) 2-3V RIGHT COMPARISON:  None. FINDINGS: Both hips are normally located. No acute hip fracture. The pubic symphysis and SI joints are intact. Suspect right-sided pubic rami fractures. No other pelvic bone fractures. IMPRESSION: No acute right hip fracture. Suspect right-sided pubic bone fractures. Electronically Signed   By: Rudie Meyer M.D.   On: 04/03/2019 09:40    EKG: Independently reviewed. Rate normal, sinus rhythm, diffuse TW flattening.       Assessment/Plan   Pelvic insufficiency fracture  -PT eval -Toradol once  -Start scheduled acetaminophen -Start oxycodone PRN for severe pain   COVID-19 pneumonitis without hypoxic respiratory failure  At present, patient has pneumonitis on CXR, but SpO2 99% on room air, and RR 14 and is minimally symptomatic (fever, fatigue, but no cough, dyspnea) -q8hrs SpO2 -If SpO2 worsening or symptoms worsen (she will be day 7 of symptoms on Monday) will start remdesivir, but at present, I feel the benefit of this is low -Reasonable to start Zinc, vitamin C    Hypertension  -Continue antihypertensives, Crestor when med rec reviewed  Remote TBI  DVT prophylaxis: Lovenox  Code Status: FULL  Family Communication: Niece and POA at bedside, CODE STATUS confirmed  Disposition Plan: Anticipate observation overnight.  PT eval tomorrow.  If able to walk/transfer tomorrow, then home.  Ebony Salts, NP from [facility] will be able to arrange lift, wheelchair, PT/OT, and hospital bed on Monday.    If unable to stand Monday, will likely need transfer to [facility] and dischareg to home on Monday with PACE support.    Consults called: None Admission status: OBS   At the point of initial evaluation, it is my clinical opinion that admission for OBSERVATION is reasonable and necessary because the patient's presenting complaints in the context of their chronic conditions represent  sufficient risk of deterioration or significant morbidity to constitute reasonable grounds for close observation in the hospital setting, but that the patient may be medically stable for discharge from the hospital within 24 to 48 hours.    Medical decision making: Patient seen at 4:43 PM on 04/03/2019.  The patient was discussed with Dr. Larinda Buttery.  What exists of the patient's chart was reviewed in depth and summarized above.  Clinical condition: stable.        Ebony Guzman [facility] Please page though AMION or Epic secure chat:  For password, contact charge nurse    (current NIH and FDA guidelines recommend no difference in use of NSAIDs in patients with COVID)

## 2019-04-03 NOTE — ED Triage Notes (Addendum)
Pt comes into the ED via EMS from home with c/o pt fall this morning ,niece/POA states she hear a boom and found the pt on the floor , but seems to be favoring her right leg, able to ambulate with assist, per EMS. Pt does complain of pain with palpation of the right hip. Pt is covid +

## 2019-04-03 NOTE — ED Notes (Addendum)
Patient transported to X-ray 

## 2019-04-03 NOTE — ED Notes (Signed)
Patient transported to CT 

## 2019-04-03 NOTE — ED Provider Notes (Addendum)
[facility] Emergency Department Provider Note   ____________________________________________   First MD Initiated Contact with Patient 04/03/19 0930     (approximate)  I have reviewed the triage vital signs and the nursing notes.   HISTORY  Chief Complaint Fall   HPI Ebony Guzman is a 68 y.o. female resents to the ED via EMS from home after patient fell this morning.  Patient states that she stumbled.  And fell.  She denies any head injury or loss of consciousness.  Family member say that there was no one in the room but they did hear her fall.  She has not been able to bear weight on her right leg according to family members.  Patient was told yesterday that she is Covid positive.  She was tested due to a grandson being positive for Covid who lives at the home.  Her symptoms were runny nose without cough.  Patient denies any shortness of breath or difficulty breathing.  She presents to the ED with a temp of 101.6.  Patient denies any other injuries.         Past Medical History:  Diagnosis Date   Head injury     There are no active problems to display for this patient.   Past Surgical History:  Procedure Laterality Date   BRAIN SURGERY     previous head injury with ongoing deficits, patient was 1.  had craniectomy   CRANIECTOMY  1968    Prior to Admission medications   Not on File    Allergies Patient has no known allergies.  No family history on file.  Social History Social History   Tobacco Use   Smoking status: Never Smoker   Smokeless tobacco: Never Used  Substance Use Topics   Alcohol use: No   Drug use: Not on file    Review of Systems Constitutional: Positive fever/chills Eyes: No visual changes. ENT: No trauma. Cardiovascular: Denies chest pain. Respiratory: Denies shortness of breath.  Denies cough. Gastrointestinal: No abdominal pain.  No nausea, no vomiting.  No diarrhea.  Genitourinary: Negative  for dysuria. Musculoskeletal: Negative for back pain.  Positive for right hip pain. Skin: Negative for rash. Neurological: Negative for headaches, focal weakness or numbness. ____________________________________________   PHYSICAL EXAM:  VITAL SIGNS: ED Triage Vitals  Enc Vitals Group     BP 04/03/19 0857 137/82     Pulse Rate 04/03/19 0857 87     Resp 04/03/19 0857 18     Temp 04/03/19 0857 (!) 101.6 F (38.7 C)     Temp Source 04/03/19 0857 Oral     SpO2 04/03/19 0857 97 %     Weight 04/03/19 0851 220 lb (99.8 kg)     Height 04/03/19 0851  (1.727 m)     Head Circumference --      Peak Flow --      Pain Score --      Pain Loc --      Pain Edu? --      Excl. in GC? --    Constitutional: Alert and oriented. Well appearing and in no acute distress.  Patient is able to answer questions but does have a stuttering problem. Eyes: Conjunctivae are normal. PERRL. EOMI. Head: Atraumatic. Nose: No congestion/rhinnorhea. Neck: No stridor.  No tenderness on palpation of cervical spine posteriorly. Cardiovascular: Normal rate, regular rhythm. Grossly normal heart sounds.  Good peripheral circulation. Respiratory: Normal respiratory effort.  No retractions. Lungs CTAB. Gastrointestinal: Soft and  nontender. No distention.  Musculoskeletal: Patient is able to move upper extremities with any difficulty and no tenderness is elicited during exam.  No point tenderness is noted anterior chest and bilateral ribs.  With compression of the hips patient does not grimace and denies any pain.  No point tenderness is noted on the examination of the lower extremities bilaterally.  No effusion noted bilateral knees.  Muscle strength on the right foot is less than left when asked to push on my hand.  She states that the pain is mostly in her right hip with this motion.  Range of motion of the right lower extremity increases patient's pain. Neurologic:  Normal speech and language. No gross focal neurologic  deficits are appreciated. Skin:  Skin is warm, dry and intact.  Psychiatric: Mood and affect are normal. Speech and behavior are normal.  ____________________________________________   LABS (all labs ordered are listed, but only abnormal results are displayed)  Labs Reviewed  COMPREHENSIVE METABOLIC PANEL - Abnormal; Notable for the following components:      Result Value   Glucose, Bld 137 (*)    Calcium 8.6 (*)    Albumin 3.3 (*)    AST 76 (*)    Alkaline Phosphatase 36 (*)    All other components within normal limits  CBC WITH DIFFERENTIAL/PLATELET - Abnormal; Notable for the following components:   Hemoglobin 11.9 (*)    HCT 35.4 (*)    All other components within normal limits  TROPONIN I (HIGH SENSITIVITY) - Abnormal; Notable for the following components:   Troponin I (High Sensitivity) 86 (*)    All other components within normal limits  SARS CORONAVIRUS 2 (TAT 6-24 HRS)  URINALYSIS, COMPLETE (UACMP) WITH MICROSCOPIC  TROPONIN I (HIGH SENSITIVITY)   ____________________________________________  EKG  Pending ____________________________________________  RADIOLOGY  Official radiology report(s): Ct Head Wo Contrast  Result Date: 04/03/2019 CLINICAL DATA:  Unwitnessed trauma this morning status post fall. EXAM: CT HEAD WITHOUT CONTRAST CT CERVICAL SPINE WITHOUT CONTRAST TECHNIQUE: Multidetector CT imaging of the head and cervical spine was performed following the standard protocol without intravenous contrast. Multiplanar CT image reconstructions of the cervical spine were also generated. COMPARISON:  Head CT April 30, 2016 FINDINGS: CT HEAD FINDINGS Brain: No acute territory infarction or intracranial hemorrhage is visualized. No focal mass lesion is identified. Asymmetric left greater than right lateral ex vacuole ventricular enlargement is similar to prior exam. Left hemispheric encephalomalacia and white matter volume loss is similar to prior exam. Stable small old  lacunar infarcts are identified in the right periventricular white matter unchanged. Vascular: No hyperdense vessel is noted. Skull: No acute abnormality. Sinuses/Orbits: No acute finding. Other: None. CT CERVICAL SPINE FINDINGS Alignment: Normal. Skull base and vertebrae: No acute fracture. No focal pathologic process is noted. Soft tissues and spinal canal: No prevertebral fluid or swelling. No visible canal hematoma. Disc levels: Extensive anterior osteophytosis is identified throughout the cervical spine. The intervertebral spaces are slightly diminished. Upper chest: Visualized lung apices are clear. Mild generalized enlargement of the thyroid gland is noted. Other: None. IMPRESSION: 1. No focal acute intracranial abnormality identified. 2. No acute fracture or dislocation of cervical spine. Electronically Signed   By: Sherian Rein M.D.   On: 04/03/2019 10:20   Ct Cervical Spine Wo Contrast  Result Date: 04/03/2019 CLINICAL DATA:  Unwitnessed trauma this morning status post fall. EXAM: CT HEAD WITHOUT CONTRAST CT CERVICAL SPINE WITHOUT CONTRAST TECHNIQUE: Multidetector CT imaging of the head and cervical spine was  performed following the standard protocol without intravenous contrast. Multiplanar CT image reconstructions of the cervical spine were also generated. COMPARISON:  Head CT April 30, 2016 FINDINGS: CT HEAD FINDINGS Brain: No acute territory infarction or intracranial hemorrhage is visualized. No focal mass lesion is identified. Asymmetric left greater than right lateral ex vacuole ventricular enlargement is similar to prior exam. Left hemispheric encephalomalacia and white matter volume loss is similar to prior exam. Stable small old lacunar infarcts are identified in the right periventricular white matter unchanged. Vascular: No hyperdense vessel is noted. Skull: No acute abnormality. Sinuses/Orbits: No acute finding. Other: None. CT CERVICAL SPINE FINDINGS Alignment: Normal. Skull base and  vertebrae: No acute fracture. No focal pathologic process is noted. Soft tissues and spinal canal: No prevertebral fluid or swelling. No visible canal hematoma. Disc levels: Extensive anterior osteophytosis is identified throughout the cervical spine. The intervertebral spaces are slightly diminished. Upper chest: Visualized lung apices are clear. Mild generalized enlargement of the thyroid gland is noted. Other: None. IMPRESSION: 1. No focal acute intracranial abnormality identified. 2. No acute fracture or dislocation of cervical spine. Electronically Signed   By: Abelardo Diesel M.D.   On: 04/03/2019 10:20   Dg Chest Portable 1 View  Result Date: 04/03/2019 CLINICAL DATA:  COVID-19 positive.  Fall this morning. EXAM: PORTABLE CHEST 1 VIEW COMPARISON:  10/31/2013 chest radiograph. FINDINGS: Stable cardiomediastinal silhouette with normal heart size. No pneumothorax. No pleural effusion. Patchy hazy opacities in the peripheral upper and lower right lung, new. No displaced fractures in the visualized chest. IMPRESSION: New patchy hazy opacities in the peripheral upper lower right lung compatible with multilobar pneumonia. Chest radiograph follow-up advised. Electronically Signed   By: Ilona Sorrel M.D.   On: 04/03/2019 10:21   Dg Hip Unilat  With Pelvis 2-3 Views Right  Result Date: 04/03/2019 CLINICAL DATA:  [place] this morning.  Right hip pain. EXAM: DG HIP (WITH OR WITHOUT PELVIS) 2-3V RIGHT COMPARISON:  None. FINDINGS: Both hips are normally located. No acute hip fracture. The pubic symphysis and SI joints are intact. Suspect right-sided pubic rami fractures. No other pelvic bone fractures. IMPRESSION: No acute right hip fracture. Suspect right-sided pubic bone fractures. Electronically Signed   By: Marijo Sanes M.D.   On: 04/03/2019 09:40    ____________________________________________   PROCEDURES  Procedure(s) performed (including Critical  Care):  Procedures  ____________________________________________   INITIAL IMPRESSION / ASSESSMENT AND PLAN / ED COURSE  As part of my medical decision making, I reviewed the following data within the electronic MEDICAL RECORD NUMBER Notes from prior ED visits and [place] Controlled Substance Database  68 year old female is brought to the ED via EMS after a fall this morning that was unwitnessed.  Patient also was told yesterday that she was Covid positive and presents with a temp of 101.6.  She states the only symptom she had of Covid was a runny nose but family members living in her home was positive which prompted her to have a test done.  Patient denies any cough or difficulty breathing..  She has not been able to bear weight on her right lower extremity due to pain.  Because this was a unwitnessed fall CT scan of head neck were ordered, right hip, portable chest x-ray, EKG, troponin and labs.  Patient had a right pubis ramus fracture, troponin of 86, positive Covid with a temp of 101.6, multi lobar pneumonia.  Patient was moved to major room #9 for admission and closer observation.  Patient handoff  was given to Dr. Larinda ButteryJessup who will be taking care of this patient on the major side of the ED at 12:33pm.  ____________________________________________   FINAL CLINICAL IMPRESSION(S) / ED DIAGNOSES  Final diagnoses:  Closed fracture of ramus of right pubis, initial encounter (HCC)  COVID-19 virus infection  Pneumonia due to COVID-19 virus  Elevated troponin     ED Discharge Orders    None       Note:  This document was prepared using Dragon voice recognition software and may include unintentional dictation errors.    Tommi RumpsSummers, Jak Haggar L, PA-C 04/03/19 1233    Tommi RumpsSummers, Icarus Partch L, PA-C 04/03/19 1234    Chesley NoonJessup, Charles, MD 04/03/19 [phone]

## 2019-04-03 NOTE — ED Notes (Signed)
Pt requesting pain medication for  A headache.

## 2019-04-03 NOTE — ED Notes (Signed)
Deneise Lever NP with [facility] called, gave her phone number. Stated she can help with getting patient discharged and setting up resources at home.  Phone number [phone] [facility] [phone]

## 2019-04-03 NOTE — ED Notes (Signed)
Pt given sandwich tray, coffee and ice water

## 2019-04-04 DIAGNOSIS — Z801 Family history of malignant neoplasm of trachea, bronchus and lung: Secondary | ICD-10-CM | POA: Diagnosis not present

## 2019-04-04 DIAGNOSIS — Z87820 Personal history of traumatic brain injury: Secondary | ICD-10-CM | POA: Diagnosis not present

## 2019-04-04 DIAGNOSIS — E119 Type 2 diabetes mellitus without complications: Secondary | ICD-10-CM | POA: Diagnosis present

## 2019-04-04 DIAGNOSIS — Z23 Encounter for immunization: Secondary | ICD-10-CM | POA: Diagnosis present

## 2019-04-04 DIAGNOSIS — U071 COVID-19: Secondary | ICD-10-CM | POA: Diagnosis present

## 2019-04-04 DIAGNOSIS — Z803 Family history of malignant neoplasm of breast: Secondary | ICD-10-CM | POA: Diagnosis not present

## 2019-04-04 DIAGNOSIS — W1830XA Fall on same level, unspecified, initial encounter: Secondary | ICD-10-CM | POA: Diagnosis present

## 2019-04-04 DIAGNOSIS — R778 Other specified abnormalities of plasma proteins: Secondary | ICD-10-CM | POA: Diagnosis present

## 2019-04-04 DIAGNOSIS — I10 Essential (primary) hypertension: Secondary | ICD-10-CM | POA: Diagnosis present

## 2019-04-04 DIAGNOSIS — Y92019 Unspecified place in single-family (private) house as the place of occurrence of the external cause: Secondary | ICD-10-CM | POA: Diagnosis not present

## 2019-04-04 DIAGNOSIS — M84454A Pathological fracture, pelvis, initial encounter for fracture: Secondary | ICD-10-CM | POA: Diagnosis present

## 2019-04-04 DIAGNOSIS — S32591A Other specified fracture of right pubis, initial encounter for closed fracture: Secondary | ICD-10-CM | POA: Diagnosis present

## 2019-04-04 DIAGNOSIS — J1289 Other viral pneumonia: Secondary | ICD-10-CM | POA: Diagnosis present

## 2019-04-04 LAB — GLUCOSE, CAPILLARY
Glucose-Capillary: 158 mg/dL — ABNORMAL HIGH (ref 70–99)
Glucose-Capillary: 187 mg/dL — ABNORMAL HIGH (ref 70–99)

## 2019-04-04 LAB — SARS CORONAVIRUS 2 (TAT 6-24 HRS): SARS Coronavirus 2: POSITIVE — AB

## 2019-04-04 MED ORDER — SODIUM CHLORIDE 0.9 % IV SOLN
200.0000 mg | Freq: Once | INTRAVENOUS | Status: AC
  Administered 2019-04-04: 200 mg via INTRAVENOUS
  Filled 2019-04-04: qty 200

## 2019-04-04 MED ORDER — DEXAMETHASONE SODIUM PHOSPHATE 10 MG/ML IJ SOLN
6.0000 mg | Freq: Every day | INTRAMUSCULAR | Status: DC
  Administered 2019-04-04 – 2019-04-05 (×2): 6 mg via INTRAVENOUS
  Filled 2019-04-04: qty 1
  Filled 2019-04-04: qty 0.6

## 2019-04-04 MED ORDER — SODIUM CHLORIDE 0.9 % IV SOLN
100.0000 mg | Freq: Every day | INTRAVENOUS | Status: DC
  Administered 2019-04-05: 09:00:00 100 mg via INTRAVENOUS
  Filled 2019-04-04: qty 100

## 2019-04-04 MED ORDER — INSULIN ASPART 100 UNIT/ML ~~LOC~~ SOLN
0.0000 [IU] | Freq: Three times a day (TID) | SUBCUTANEOUS | Status: DC
  Administered 2019-04-04: 2 [IU] via SUBCUTANEOUS
  Administered 2019-04-04: 1 [IU] via SUBCUTANEOUS
  Administered 2019-04-05: 2 [IU] via SUBCUTANEOUS
  Filled 2019-04-04 (×3): qty 1

## 2019-04-04 MED ORDER — PNEUMOCOCCAL VAC POLYVALENT 25 MCG/0.5ML IJ INJ
0.5000 mL | INJECTION | INTRAMUSCULAR | Status: AC
  Administered 2019-04-05: 0.5 mL via INTRAMUSCULAR
  Filled 2019-04-04: qty 0.5

## 2019-04-04 NOTE — Consult Note (Signed)
ORTHOPAEDIC CONSULTATION  REQUESTING PHYSICIAN: Charise Killian, MD  Chief Complaint: Right pelvis pain  HPI: Ebony Guzman is a 68 y.o. female who complains of right pelvis pain after a fall at home yesterday.  She was unable to ambulate well.  She was seen in the emergency room where exam x-rays and a CT scan show hairline fractures of the right pubic rami which are nondisplaced.  She is being admitted for observation and therapy and possible nursing home placement.  Past Medical History:  Diagnosis Date  . Diabetes mellitus without complication (HCC)   . Head injury   . Hypertension    Past Surgical History:  Procedure Laterality Date  . BRAIN SURGERY     previous head injury with ongoing deficits, patient was 4.  had craniectomy  . CRANIECTOMY  1968   Social History   Socioeconomic History  . Marital status: Single    Spouse name: Not on file  . Number of children: Not on file  . Years of education: Not on file  . Highest education level: Not on file  Occupational History  . Not on file  Social Needs  . Financial resource strain: Not on file  . Food insecurity    Worry: Not on file    Inability: Not on file  . Transportation needs    Medical: Not on file    Non-medical: Not on file  Tobacco Use  . Smoking status: Never Smoker  . Smokeless tobacco: Never Used  Substance and Sexual Activity  . Alcohol use: No  . Drug use: Not on file  . Sexual activity: Not on file  Lifestyle  . Physical activity    Days per week: Not on file    Minutes per session: Not on file  . Stress: Not on file  Relationships  . Social Musician on phone: Not on file    Gets together: Not on file    Attends religious service: Not on file    Active member of club or organization: Not on file    Attends meetings of clubs or organizations: Not on file    Relationship status: Not on file  Other Topics Concern  . Not on file  Social History Narrative  . Not on  file   Family History  Problem Relation Age of Onset  . Breast cancer Mother   . Lung cancer Father    No Known Allergies Prior to Admission medications   Medication Sig Start Date End Date Taking? Authorizing Provider  amLODipine (NORVASC) 5 MG tablet Take 5 mg by mouth daily.   Yes [provider]  aspirin 81 MG chewable tablet Chew 81 mg by mouth daily.   Yes [provider]  atenolol (TENORMIN) 25 MG tablet Take 10 mg by mouth daily.   Yes [provider]  cholecalciferol (VITAMIN D3) 25 MCG (1000 UT) tablet Take 1,000 Units by mouth daily.   Yes [provider]  citalopram (CELEXA) 20 MG tablet Take 20 mg by mouth daily.   Yes [provider]  docusate sodium (COLACE) 100 MG capsule Take 100 mg by mouth daily.   Yes [provider]  lisinopril (ZESTRIL) 40 MG tablet Take 40 mg by mouth daily.   Yes [provider]  Multiple Vitamins-Minerals (MULTIVITAMIN ADULT PO) Take 1 tablet by mouth daily.   Yes [provider]  rosuvastatin (CRESTOR) 10 MG tablet Take 10 mg by mouth at bedtime.   Yes  [provider]   Ct Head Wo Contrast  Result Date: 04/03/2019 CLINICAL DATA:  Unwitnessed trauma this morning status post fall. EXAM: CT HEAD WITHOUT CONTRAST CT CERVICAL SPINE WITHOUT CONTRAST TECHNIQUE: Multidetector CT imaging of the head and cervical spine was performed following the standard protocol without intravenous contrast. Multiplanar CT image reconstructions of the cervical spine were also generated. COMPARISON:  Head CT April 30, 2016 FINDINGS: CT HEAD FINDINGS Brain: No acute territory infarction or intracranial hemorrhage is visualized. No focal mass lesion is identified. Asymmetric left greater than right lateral ex vacuole ventricular enlargement is similar to prior exam. Left hemispheric encephalomalacia and white matter volume loss is similar to prior exam. Stable small old lacunar infarcts are  identified in the right periventricular white matter unchanged. Vascular: No hyperdense vessel is noted. Skull: No acute abnormality. Sinuses/Orbits: No acute finding. Other: None. CT CERVICAL SPINE FINDINGS Alignment: Normal. Skull base and vertebrae: No acute fracture. No focal pathologic process is noted. Soft tissues and spinal canal: No prevertebral fluid or swelling. No visible canal hematoma. Disc levels: Extensive anterior osteophytosis is identified throughout the cervical spine. The intervertebral spaces are slightly diminished. Upper chest: Visualized lung apices are clear. Mild generalized enlargement of the thyroid gland is noted. Other: None. IMPRESSION: 1. No focal acute intracranial abnormality identified. 2. No acute fracture or dislocation of cervical spine. Electronically Signed   By: Abelardo Diesel M.D.   On: 04/03/2019 10:20   Ct Cervical Spine Wo Contrast  Result Date: 04/03/2019 CLINICAL DATA:  Unwitnessed trauma this morning status post fall. EXAM: CT HEAD WITHOUT CONTRAST CT CERVICAL SPINE WITHOUT CONTRAST TECHNIQUE: Multidetector CT imaging of the head and cervical spine was performed following the standard protocol without intravenous contrast. Multiplanar CT image reconstructions of the cervical spine were also generated. COMPARISON:  Head CT April 30, 2016 FINDINGS: CT HEAD FINDINGS Brain: No acute territory infarction or intracranial hemorrhage is visualized. No focal mass lesion is identified. Asymmetric left greater than right lateral ex vacuole ventricular enlargement is similar to prior exam. Left hemispheric encephalomalacia and white matter volume loss is similar to prior exam. Stable small old lacunar infarcts are identified in the right periventricular white matter unchanged. Vascular: No hyperdense vessel is noted. Skull: No acute abnormality. Sinuses/Orbits: No acute finding. Other: None. CT CERVICAL SPINE FINDINGS Alignment: Normal. Skull base and vertebrae: No acute  fracture. No focal pathologic process is noted. Soft tissues and spinal canal: No prevertebral fluid or swelling. No visible canal hematoma. Disc levels: Extensive anterior osteophytosis is identified throughout the cervical spine. The intervertebral spaces are slightly diminished. Upper chest: Visualized lung apices are clear. Mild generalized enlargement of the thyroid gland is noted. Other: None. IMPRESSION: 1. No focal acute intracranial abnormality identified. 2. No acute fracture or dislocation of cervical spine. Electronically Signed   By: Abelardo Diesel M.D.   On: 04/03/2019 10:20   Ct Pelvis Wo Contrast  Result Date: 04/03/2019 CLINICAL DATA:  Right hip pain after fall EXAM: CT PELVIS WITHOUT CONTRAST TECHNIQUE: Multidetector CT imaging of the pelvis was performed following the standard protocol without intravenous contrast. COMPARISON:  x-ray 04/03/2019 FINDINGS: Bones/Joint/Cartilage Very subtle cortical lucency of the medial aspect of the right inferior pubic ramus (series 2, image 91) which could represent a prominent vascular channel versus a nondisplaced fracture. The osseous structures appear otherwise intact. No additional fractures. SI joints and pubic symphysis are intact without diastasis. Bilateral hips are intact without fracture or dislocation. Right greater than left facet arthropathy  noted at the L4-5 level. Assimilation joint on the right at L5-S1. No demonstrable hip joint effusion. Ligaments Suboptimally evaluated by CT. Muscles and Tendons Tendinous structures about the pelvis appear grossly intact. No focal muscular abnormality. No right adductor muscle group hematoma. Soft tissues No soft tissue fluid collection or hematoma. No acute intrapelvic abnormality. IMPRESSION: 1. Very subtle cortical lucency of the medial aspect of the right inferior pubic ramus which could represent a prominent vascular channel versus a nondisplaced fracture. The osseous structures appear otherwise  intact. 2. No acute findings within the pelvis or surrounding soft tissues. Electronically Signed   By: Duanne GuessNicholas  Plundo M.D.   On: 04/03/2019 14:46   Dg Chest Portable 1 View  Result Date: 04/03/2019 CLINICAL DATA:  COVID-19 positive.  Fall this morning. EXAM: PORTABLE CHEST 1 VIEW COMPARISON:  10/31/2013 chest radiograph. FINDINGS: Stable cardiomediastinal silhouette with normal heart size. No pneumothorax. No pleural effusion. Patchy hazy opacities in the peripheral upper and lower right lung, new. No displaced fractures in the visualized chest. IMPRESSION: New patchy hazy opacities in the peripheral upper lower right lung compatible with multilobar pneumonia. Chest radiograph follow-up advised. Electronically Signed   By: Delbert PhenixJason A Poff M.D.   On: 04/03/2019 10:21   Dg Hip Unilat  With Pelvis 2-3 Views Right  Result Date: 04/03/2019 CLINICAL DATA:  Larey SeatFell this morning.  Right hip pain. EXAM: DG HIP (WITH OR WITHOUT PELVIS) 2-3V RIGHT COMPARISON:  None. FINDINGS: Both hips are normally located. No acute hip fracture. The pubic symphysis and SI joints are intact. Suspect right-sided pubic rami fractures. No other pelvic bone fractures. IMPRESSION: No acute right hip fracture. Suspect right-sided pubic bone fractures. Electronically Signed   By: Rudie MeyerP.  Gallerani M.D.   On: 04/03/2019 09:40    Positive ROS: All other systems have been reviewed and were otherwise negative with the exception of those mentioned in the HPI and as above.  Physical Exam: General: Alert, no acute distress Cardiovascular: No pedal edema Respiratory: No cyanosis, no use of accessory musculature GI: No organomegaly, abdomen is soft and non-tender Skin: No lesions in the area of chief complaint Neurologic: Sensation intact distally Psychiatric: Patient is competent for consent with normal mood and affect Lymphatic: No axillary or cervical lymphadenopathy  MUSCULOSKELETAL: Patient is alert and cooperative.  She has pain with  movement of the right leg.  The right pelvis is mildly tender.  Leg is not shortened or rotated.  Neurovascular status is good.  No other injuries are noted.  Assessment: Hairline fractures right pubic rami  Plan: Symptomatic care Ambulate with PT weightbearing as tolerated on the right. Nursing placement as necessary. Return to my clinic in 2 weeks for exam and x-rays of the pelvis.    Valinda HoarHoward E Rome Echavarria, MD [phone]   04/04/2019 4:50 PM

## 2019-04-04 NOTE — Progress Notes (Signed)
PROGRESS NOTE    Ebony Guzman  MWN:[id number] DOB: 05/31/1950 DOA: 04/03/2019 PCP: Delton PrairieBrooks, Keatah B, FNP      Assessment & Plan:   Principal Problem:   Pelvic fracture (HCC) Active Problems:   COVID-19 virus infection   Essential hypertension   Type 2 diabetes mellitus without complication, without long-term current use of insulin (HCC)   TBI (traumatic brain injury) (HCC)  Public insufficiency fracture: Continue on acetaminophen and oxycodone as needed for pain.  PT eval pending  COVID-19 pneumonitis without hypoxic respiratory failure: will continue on zinc, vitamin c and will start steroids & remdesivir. Tylenol prn for fevers.  Airborne and contact isolation precautions. Encourage incentive spirometry  Hypertension: continue to hold home dose of amlodipine and atenolol secondary to low normal blood pressure  DM2: Likely diet controlled as patient takes no DM meds at home. Will start SSI w/ accuchecks while on steroids  Transaminitis: ALT is WNL, AST is elevated. Will continue to monitor   Remote TBI: Continue supportive care  DVT prophylaxis: Lovenox Code Status: Full Disposition Plan: Awaiting PT eval   Consultants:   n/a   Procedures: n/a   Antimicrobials: n/a   Subjective: Patient complains of fatigue  Objective: Vitals:   04/03/19 2300 04/03/19 2316 04/04/19 0400 04/04/19 0532  BP: 101/64 111/61 (!) 135/57 118/66  Pulse: 62 68 79 97  Resp: 19 19 (!) 23 (!) 27  Temp:  99.6 F (37.6 C)  (!) 103.2 F (39.6 C)  TempSrc:  Oral  Oral  SpO2: 97% 97% 95% 95%  Weight:      Height:       No intake or output data in the 24 hours ending 04/04/19 0756 Filed Weights   04/03/19 0851  Weight: 99.8 kg    Examination:  General exam: Appears calm and comfortable  Respiratory system: Decreased breath sounds bilaterally. Respiratory effort normal. Cardiovascular system: S1 & S2 normal, No rubs, gallops. No pedal edema. Gastrointestinal system:  Abdomen is nondistended, soft and nontender. Normal bowel sounds heard. Central nervous system: Alert and oriented to self & place. Moves all 4 extremities Psychiatry: Flat mood and affect.     Data Reviewed: I have personally reviewed following labs and imaging studies  CBC: Recent Labs  Lab 04/03/19 1032  WBC 6.6  NEUTROABS 5.1  HGB 11.9*  HCT 35.4*  MCV 86.8  PLT 193   Basic Metabolic Panel: Recent Labs  Lab 04/03/19 1032  NA 136  K 3.8  CL 102  CO2 24  GLUCOSE 137*  BUN 12  CREATININE 0.80  CALCIUM 8.6*   GFR: Estimated Creatinine Clearance: 80.9 mL/min (by C-G formula based on SCr of 0.8 mg/dL). Liver Function Tests: Recent Labs  Lab 04/03/19 1032  AST 76*  ALT 28  ALKPHOS 36*  BILITOT 0.4  PROT 7.2  ALBUMIN 3.3*   No results for input(s): LIPASE, AMYLASE in the last 168 hours. No results for input(s): AMMONIA in the last 168 hours. Coagulation Profile: No results for input(s): INR, PROTIME in the last 168 hours. Cardiac Enzymes: No results for input(s): CKTOTAL, CKMB, CKMBINDEX, TROPONINI in the last 168 hours. BNP (last 3 results) No results for input(s): PROBNP in the last 8760 hours. HbA1C: No results for input(s): HGBA1C in the last 72 hours. CBG: No results for input(s): GLUCAP in the last 168 hours. Lipid Profile: No results for input(s): CHOL, HDL, LDLCALC, TRIG, CHOLHDL, LDLDIRECT in the last 72 hours. Thyroid Function Tests: No results for input(s): TSH,  T4TOTAL, FREET4, T3FREE, THYROIDAB in the last 72 hours. Anemia Panel: No results for input(s): VITAMINB12, FOLATE, FERRITIN, TIBC, IRON, RETICCTPCT in the last 72 hours. Sepsis Labs: Recent Labs  Lab 04/03/19 1244  PROCALCITON <0.10    Recent Results (from the past 240 hour(s))  SARS CORONAVIRUS 2 (TAT 6-24 HRS) Nasopharyngeal Nasopharyngeal Swab     Status: Abnormal   Collection Time: 04/03/19 12:44 PM   Specimen: Nasopharyngeal Swab  Result Value Ref Range Status   SARS  Coronavirus 2 POSITIVE (A) NEGATIVE Final    Comment: RESULT CALLED TO, READ BACK BY AND VERIFIED WITH: KOsie Bond 0155 04/04/2019 T. TYSOR (NOTE) SARS-CoV-2 target nucleic acids are DETECTED. The SARS-CoV-2 RNA is generally detectable in upper and lower respiratory specimens during the acute phase of infection. Positive results are indicative of the presence of SARS-CoV-2 RNA. Clinical correlation with patient history and other diagnostic information is  necessary to determine patient infection status. Positive results do not rule out bacterial infection or co-infection with other viruses.  The expected result is Negative. Fact Sheet for Patients: SugarRoll.be Fact Sheet for Healthcare Providers: https://www.woods-mathews.com/ This test is not yet approved or cleared by the [place] FDA and  has been authorized for detection and/or diagnosis of SARS-CoV-2 by FDA under an Emergency Use Authorization (EUA). This EUA will remain  in effect (meaning this test can be used) for t he duration of the COVID-19 declaration under Section 564(b)(1) of the Act, 21 U.S.C. section 360bbb-3(b)(1), unless the authorization is terminated or revoked sooner. Performed at [facility], [place] [address], [place], [place] [address]          Radiology Studies: Ct Head Wo Contrast  Result Date: 04/03/2019 CLINICAL DATA:  Unwitnessed trauma this morning status post fall. EXAM: CT HEAD WITHOUT CONTRAST CT CERVICAL SPINE WITHOUT CONTRAST TECHNIQUE: Multidetector CT imaging of the head and cervical spine was performed following the standard protocol without intravenous contrast. Multiplanar CT image reconstructions of the cervical spine were also generated. COMPARISON:  Head CT April 30, 2016 FINDINGS: CT HEAD FINDINGS Brain: No acute territory infarction or intracranial hemorrhage is visualized. No focal mass lesion is identified. Asymmetric left greater  than right lateral ex vacuole ventricular enlargement is similar to prior exam. Left hemispheric encephalomalacia and white matter volume loss is similar to prior exam. Stable small old lacunar infarcts are identified in the right periventricular white matter unchanged. Vascular: No hyperdense vessel is noted. Skull: No acute abnormality. Sinuses/Orbits: No acute finding. Other: None. CT CERVICAL SPINE FINDINGS Alignment: Normal. Skull base and vertebrae: No acute fracture. No focal pathologic process is noted. Soft tissues and spinal canal: No prevertebral fluid or swelling. No visible canal hematoma. Disc levels: Extensive anterior osteophytosis is identified throughout the cervical spine. The intervertebral spaces are slightly diminished. Upper chest: Visualized lung apices are clear. Mild generalized enlargement of the thyroid gland is noted. Other: None. IMPRESSION: 1. No focal acute intracranial abnormality identified. 2. No acute fracture or dislocation of cervical spine. Electronically Signed   By: Abelardo Diesel M.D.   On: 04/03/2019 10:20   Ct Cervical Spine Wo Contrast  Result Date: 04/03/2019 CLINICAL DATA:  Unwitnessed trauma this morning status post fall. EXAM: CT HEAD WITHOUT CONTRAST CT CERVICAL SPINE WITHOUT CONTRAST TECHNIQUE: Multidetector CT imaging of the head and cervical spine was performed following the standard protocol without intravenous contrast. Multiplanar CT image reconstructions of the cervical spine were also generated. COMPARISON:  Head CT April 30, 2016 FINDINGS: CT  HEAD FINDINGS Brain: No acute territory infarction or intracranial hemorrhage is visualized. No focal mass lesion is identified. Asymmetric left greater than right lateral ex vacuole ventricular enlargement is similar to prior exam. Left hemispheric encephalomalacia and white matter volume loss is similar to prior exam. Stable small old lacunar infarcts are identified in the right periventricular white matter  unchanged. Vascular: No hyperdense vessel is noted. Skull: No acute abnormality. Sinuses/Orbits: No acute finding. Other: None. CT CERVICAL SPINE FINDINGS Alignment: Normal. Skull base and vertebrae: No acute fracture. No focal pathologic process is noted. Soft tissues and spinal canal: No prevertebral fluid or swelling. No visible canal hematoma. Disc levels: Extensive anterior osteophytosis is identified throughout the cervical spine. The intervertebral spaces are slightly diminished. Upper chest: Visualized lung apices are clear. Mild generalized enlargement of the thyroid gland is noted. Other: None. IMPRESSION: 1. No focal acute intracranial abnormality identified. 2. No acute fracture or dislocation of cervical spine. Electronically Signed   By: Sherian Rein M.D.   On: 04/03/2019 10:20   Ct Pelvis Wo Contrast  Result Date: 04/03/2019 CLINICAL DATA:  Right hip pain after fall EXAM: CT PELVIS WITHOUT CONTRAST TECHNIQUE: Multidetector CT imaging of the pelvis was performed following the standard protocol without intravenous contrast. COMPARISON:  x-ray 04/03/2019 FINDINGS: Bones/Joint/Cartilage Very subtle cortical lucency of the medial aspect of the right inferior pubic ramus (series 2, image 91) which could represent a prominent vascular channel versus a nondisplaced fracture. The osseous structures appear otherwise intact. No additional fractures. SI joints and pubic symphysis are intact without diastasis. Bilateral hips are intact without fracture or dislocation. Right greater than left facet arthropathy noted at the L4-5 level. Assimilation joint on the right at L5-S1. No demonstrable hip joint effusion. Ligaments Suboptimally evaluated by CT. Muscles and Tendons Tendinous structures about the pelvis appear grossly intact. No focal muscular abnormality. No right adductor muscle group hematoma. Soft tissues No soft tissue fluid collection or hematoma. No acute intrapelvic abnormality. IMPRESSION: 1. Very  subtle cortical lucency of the medial aspect of the right inferior pubic ramus which could represent a prominent vascular channel versus a nondisplaced fracture. The osseous structures appear otherwise intact. 2. No acute findings within the pelvis or surrounding soft tissues. Electronically Signed   By: Duanne Guess M.D.   On: 04/03/2019 14:46   Dg Chest Portable 1 View  Result Date: 04/03/2019 CLINICAL DATA:  COVID-19 positive.  Fall this morning. EXAM: PORTABLE CHEST 1 VIEW COMPARISON:  10/31/2013 chest radiograph. FINDINGS: Stable cardiomediastinal silhouette with normal heart size. No pneumothorax. No pleural effusion. Patchy hazy opacities in the peripheral upper and lower right lung, new. No displaced fractures in the visualized chest. IMPRESSION: New patchy hazy opacities in the peripheral upper lower right lung compatible with multilobar pneumonia. Chest radiograph follow-up advised. Electronically Signed   By: Delbert Phenix M.D.   On: 04/03/2019 10:21   Dg Hip Unilat  With Pelvis 2-3 Views Right  Result Date: 04/03/2019 CLINICAL DATA:  Larey Seat this morning.  Right hip pain. EXAM: DG HIP (WITH OR WITHOUT PELVIS) 2-3V RIGHT COMPARISON:  None. FINDINGS: Both hips are normally located. No acute hip fracture. The pubic symphysis and SI joints are intact. Suspect right-sided pubic rami fractures. No other pelvic bone fractures. IMPRESSION: No acute right hip fracture. Suspect right-sided pubic bone fractures. Electronically Signed   By: Rudie Meyer M.D.   On: 04/03/2019 09:40        Scheduled Meds: . acetaminophen  1,000 mg Oral TID  .  enoxaparin (LOVENOX) injection  40 mg Subcutaneous Q24H  . vitamin C  500 mg Oral Daily  . zinc sulfate  220 mg Oral Daily   Continuous Infusions:   LOS: 0 days    Time spent: 30 minutes    Charise Killian, MD [facility] Pager [phone]  If 7PM-7AM, please contact night-coverage [email or web address] Password [email or web address] 04/04/2019, 7:56 AM

## 2019-04-04 NOTE — ED Notes (Signed)
Attempted to call report, unit states RN is not ready for report and will call back. Agricultural consultant notified.

## 2019-04-04 NOTE — ED Notes (Signed)
Admitting at bedside 

## 2019-04-04 NOTE — Evaluation (Signed)
Physical Therapy Evaluation Patient Details Name: Ebony Guzman MRN: [id number] DOB: Sep 22, 1950 Today's Date: 04/04/2019   History of Present Illness  presented to ER secondary to mechanical fall with acute onset of R hip pain; admitted for management of R inferior pubic ramus fracture, WBAT, and for COVID-19 pneumonitis without respiratory failure.  Clinical Impression  Upon evaluation, patient alert and oriented; follows commands and participates well with session.  Moderate neurogenic stuttering, mild expressive aphasia noted, but patient self-corrects with increased time. Rates pain well-controlled with medications, rated 0/10 at current time.  Residual strength/coordination/tone deficits noted throughout R LE > UE, but able to mobilize through range functional for basic transfers and gait.  Currently requiring min/mod assist for bed mobility; sup/min assist for unsupported sitting; mod assist for sit/stand and short-distance gait (5') with RW.  Demonstrates step to gait pattern, mod inconsistency in R foot placement with noted difficulty with R knee flex throughout gait attempts.  R lateral lean requiring mod cuing for L ant/lateral weight shift and midline correction; poor balance reactions, all planes.  Requires use of RW and +1 hands-on assist at all times. Would benefit from skilled PT to address above deficits and promote optimal return to PLOF.; recommend transition to STR upon discharge from acute hospitalization.     Follow Up Recommendations SNF    Equipment Recommendations  Rolling walker with 5" wheels;3in1 (PT)    Recommendations for Other Services       Precautions / Restrictions Precautions Precautions: Fall Restrictions Weight Bearing Restrictions: Yes RLE Weight Bearing: Weight bearing as tolerated      Mobility  Bed Mobility Overal bed mobility: Needs Assistance Bed Mobility: Supine to Sit;Sit to Supine     Supine to sit: Mod assist Sit to supine: Mod  assist      Transfers Overall transfer level: Needs assistance Equipment used: Rolling walker (2 wheeled) Transfers: Sit to/from Stand Sit to Stand: Mod assist         General transfer comment: assist for bilat LE foot placement, weight shift, lift off and balance; R lateral lean, worsens with exertion and fatigue  Ambulation/Gait Ambulation/Gait assistance: Mod assist Gait Distance (Feet): (5' laterally, forward, backward) Assistive device: Rolling walker (2 wheeled)       General Gait Details: step to gait pattern, mod inconsistency in R foot placement with noted difficulty with R knee flex throughout gait attempts.  R lateral lean requiring mod cuing for L ant/lateral weight shift and midline correction; poor balance reactions, all planes.  Requires use of RW and +1 hands-on assist at all times.  Stairs            Wheelchair Mobility    Modified Rankin (Stroke Patients Only)       Balance Overall balance assessment: Needs assistance Sitting-balance support: No upper extremity supported;Feet supported Sitting balance-Leahy Scale: Fair   Postural control: Right lateral lean Standing balance support: Bilateral upper extremity supported Standing balance-Leahy Scale: Poor Standing balance comment: R lateral lean, mod assist for balance correction/midline at all times                             Pertinent Vitals/Pain Pain Assessment: No/denies pain    Home Living Family/patient expects to be discharged to:: Private residence Living Arrangements: (Niece) Available Help at Discharge: Family;Available 24 hours/day Type of Home: House Home Access: Ramped entrance     Home Layout: One level Home Equipment: Walker - 2 wheels Additional  Comments: PACE program participant    Prior Function Level of Independence: Needs assistance         Comments: +1 assist for ADLs, household mobilization with RW; supported by PACE program     Hand Dominance    Dominant Hand: Right    Extremity/Trunk Assessment   Upper Extremity Assessment Upper Extremity Assessment: (R UE grossly 4/5, mild difficulty with coordination (residual CVA defiits))    Lower Extremity Assessment Lower Extremity Assessment: RLE deficits/detail(R LE grossly 3-/5 throughout, generally stiff with increased tone (difficulty terminating extension) (residual from CVA).  L LE grossly WFL)       Communication   Communication: (neurogenic stuttering, but self-corrects with increased time)  Cognition Arousal/Alertness: Awake/alert Behavior During Therapy: WFL for tasks assessed/performed Overall Cognitive Status: Within Functional Limits for tasks assessed                                        General Comments      Exercises Other Exercises Other Exercises: Sitting and standing, worked to promote L ant/lateral weight shift and orientation to midline.  Corrects wiht min cuing/assist from therapist, but decreased awareness otherwise. Other Exercises: Constant, hands-on assist required for standing balance and balance recovery with all stepping patterns (forward, backward, laterally)   Assessment/Plan    PT Assessment Patient needs continued PT services  PT Problem List Decreased strength;Decreased range of motion;Decreased activity tolerance;Decreased balance;Decreased mobility;Decreased coordination;Decreased knowledge of use of DME;Decreased safety awareness;Decreased knowledge of precautions;Pain       PT Treatment Interventions DME instruction;Gait training;Functional mobility training;Therapeutic activities;Therapeutic exercise;Balance training;Patient/family education    PT Goals (Current goals can be found in the Care Plan section)  Acute Rehab PT Goals Patient Stated Goal: to get better PT Goal Formulation: With patient Time For Goal Achievement: 04/18/19 Potential to Achieve Goals: Good    Frequency Min 2X/week   Barriers to  discharge        Co-evaluation               AM-PAC PT "6 Clicks" Mobility  Outcome Measure Help needed turning from your back to your side while in a flat bed without using bedrails?: A Little Help needed moving from lying on your back to sitting on the side of a flat bed without using bedrails?: A Little Help needed moving to and from a bed to a chair (including a wheelchair)?: A Lot Help needed standing up from a chair using your arms (e.g., wheelchair or bedside chair)?: A Lot Help needed to walk in hospital room?: A Lot Help needed climbing 3-5 steps with a railing? : A Lot 6 Click Score: 14    End of Session Equipment Utilized During Treatment: Gait belt Activity Tolerance: Patient tolerated treatment well Patient left: in bed;with call bell/phone within reach Nurse Communication: Mobility status PT Visit Diagnosis: Muscle weakness (generalized) (M62.81);Difficulty in walking, not elsewhere classified (R26.2)    Time: 3532-9924 PT Time Calculation (min) (ACUTE ONLY): 25 min   Charges:   PT Evaluation $PT Eval Moderate Complexity: 1 Mod PT Treatments $Therapeutic Activity: 8-22 mins        Lacy Taglieri H. Manson Passey, PT, DPT, NCS 04/04/19, 4:13 PM [phone]

## 2019-04-04 NOTE — ED Notes (Signed)
Pt sat up on bed with side rails raised, provided breakfast tray. Denies any other needs at this time.

## 2019-04-04 NOTE — ED Notes (Signed)
Pt educated on external catheter. No other needs identified at this time.

## 2019-04-04 NOTE — ED Notes (Addendum)
Pt bed is dry. Bruising noted on right thigh and hip. Pedal pulses 3+ bilaterally, skin warm and intact. No further pt needs identified at this time.

## 2019-04-04 NOTE — ED Notes (Signed)
Pt provided lunch tray. Sat up in bed to be able to eat.

## 2019-04-04 NOTE — ED Notes (Signed)
Attempted to call report again. Unit states nurse is in pt room and cannot take report, states room will be 233 bc pt is COVID positive and room is not ready. Notified unit to call if RN has questions and notify ED when bed is ready for pt.

## 2019-04-04 NOTE — ED Notes (Signed)
Niece Ivin Booty called at this time and notified that pt will be moved to inpatient bed and given update on pt's status.

## 2019-04-04 NOTE — TOC Initial Note (Signed)
Transition of Care [facility]) - Initial/Assessment Note    Patient Details  Name: Ebony Guzman MRN: [id number] Date of Birth: 22-Jul-1950  [facility]) CM/SW Contact:    Katrina Stack, RN Phone Number: 04/04/2019, 6:47 PM  Clinical Narrative:                Patient from home under the care of  a niece and [facility].  Placed in observation for pelvic fracture.  Has tested positive for Covid .  Physical has seen and currently recommending skilled nursing placement.  Positive covid status could be a barrier        Patient Goals and CMS Choice        Expected Discharge Plan and Services                                                Prior Living Arrangements/Services                       Activities of Daily Living Home Assistive Devices/Equipment: Gilford Rile (specify type) ADL Screening (condition at time of admission) Patient's cognitive ability adequate to safely complete daily activities?: Yes Is the patient deaf or have difficulty hearing?: No Does the patient have difficulty seeing, even when wearing glasses/contacts?: No Does the patient have difficulty concentrating, remembering, or making decisions?: No Patient able to express need for assistance with ADLs?: Yes Does the patient have difficulty dressing or bathing?: No Independently performs ADLs?: Yes (appropriate for developmental age) Does the patient have difficulty walking or climbing stairs?: No Weakness of Legs: Right Weakness of Arms/Hands: None  Permission Sought/Granted                  Emotional Assessment              Admission diagnosis:  Closed fracture of ramus of right pubis, initial encounter ([place]) [S32.591A] COVID-19 virus infection [U07.1] Pneumonia due to COVID-19 virus [U07.1, J12.89] COVID-19 [U07.1] Patient Active Problem List   Diagnosis Date Noted  . COVID-19 04/04/2019  . Pelvic fracture ([place]) 04/03/2019  . COVID-19 virus infection  04/03/2019  . Essential hypertension 04/03/2019  . Type 2 diabetes mellitus without complication, without long-term current use of insulin ([place]) 04/03/2019  . TBI (traumatic brain injury) ([place]) 04/03/2019   PCP:  Talbert Cage, FNP Pharmacy:  No Pharmacies Listed    Social Determinants of Health (SDOH) Interventions    Readmission Risk Interventions No flowsheet data found.

## 2019-04-05 DIAGNOSIS — S32591A Other specified fracture of right pubis, initial encounter for closed fracture: Secondary | ICD-10-CM

## 2019-04-05 DIAGNOSIS — E119 Type 2 diabetes mellitus without complications: Secondary | ICD-10-CM

## 2019-04-05 LAB — COMPREHENSIVE METABOLIC PANEL
ALT: 33 U/L (ref 0–44)
AST: 92 U/L — ABNORMAL HIGH (ref 15–41)
Albumin: 3 g/dL — ABNORMAL LOW (ref 3.5–5.0)
Alkaline Phosphatase: 30 U/L — ABNORMAL LOW (ref 38–126)
Anion gap: 8 (ref 5–15)
BUN: 17 mg/dL (ref 8–23)
CO2: 27 mmol/L (ref 22–32)
Calcium: 9 mg/dL (ref 8.9–10.3)
Chloride: 104 mmol/L (ref 98–111)
Creatinine, Ser: 0.66 mg/dL (ref 0.44–1.00)
GFR calc Af Amer: >60 mL/min (ref >60)
GFR calc non Af Amer: >60 mL/min (ref >60)
Glucose, Bld: 137 mg/dL — ABNORMAL HIGH (ref 70–99)
Potassium: 3.5 mmol/L (ref 3.5–5.1)
Sodium: 139 mmol/L (ref 135–145)
Total Bilirubin: 0.6 mg/dL (ref 0.3–1.2)
Total Protein: 7.1 g/dL (ref 6.5–8.1)

## 2019-04-05 LAB — CBC
HCT: 36.4 % (ref 36.0–46.0)
Hemoglobin: 12.3 g/dL (ref 12.0–15.0)
MCH: 29.4 pg (ref 26.0–34.0)
MCHC: 33.8 g/dL (ref 30.0–36.0)
MCV: 87.1 fL (ref 80.0–100.0)
Platelets: 244 10*3/uL (ref 150–400)
RBC: 4.18 MIL/uL (ref 3.87–5.11)
RDW: 13.6 % (ref 11.5–15.5)
WBC: 7.5 10*3/uL (ref 4.0–10.5)
nRBC: 0 % (ref 0.0–0.2)

## 2019-04-05 LAB — GLUCOSE, CAPILLARY
Glucose-Capillary: 102 mg/dL — ABNORMAL HIGH (ref 70–99)
Glucose-Capillary: 173 mg/dL — ABNORMAL HIGH (ref 70–99)

## 2019-04-05 MED ORDER — DEXAMETHASONE 6 MG PO TABS: 6.0000 mg | ORAL_TABLET | Freq: Every day | ORAL | 0 refills | Status: AC

## 2019-04-05 MED ORDER — ACETAMINOPHEN 500 MG PO TABS: 1000.0000 mg | ORAL_TABLET | Freq: Three times a day (TID) | ORAL | 0 refills | Status: AC

## 2019-04-05 NOTE — Progress Notes (Signed)
Subjective:      Patient is stable.  Pain is moderate.  She awaits nursing placement.  Patient reports pain as mild.  Objective:   VITALS:   Vitals:   04/05/19 0409 04/05/19 0748  BP: 131/74 140/67  Pulse: 67 61  Resp: 20 19  Temp: 97.9 F (36.6 C) 97.9 F (36.6 C)  SpO2: 98% 100%    Neurologically intact ABD soft Neurovascular intact Sensation intact distally Intact pulses distally Dorsiflexion/Plantar flexion intact  LABS Recent Labs    04/03/19 1032 04/05/19 0604  HGB 11.9* 12.3  HCT 35.4* 36.4  WBC 6.6 7.5  PLT 193 244    Recent Labs    04/03/19 1032 04/05/19 0604  NA 136 139  K 3.8 3.5  BUN 12 17  CREATININE 0.80 0.66  GLUCOSE 137* 137*    No results for input(s): LABPT, INR in the last 72 hours.   Assessment/Plan:      Advance diet Up with therapy Discharge to SNF when bed available.  Follow-up in my office in 2 weeks for exam and x-ray.  Recommend enteric-coated aspirin 325 mg twice daily for DVT prophylaxis on discharge.

## 2019-04-05 NOTE — TOC Progression Note (Signed)
Transition of Care [facility]) - Progression Note    Patient Details  Name: Ebony Guzman MRN: [id number] Date of Birth: Feb 14, 1951  Transition of Care [facility]) CM/SW Contact  Arminda Foglio, [place], [place] Phone Number: 04/05/2019, 1:06 PM  Clinical Narrative:    Patient to discharge home today with [facility] services. Patient currently receives services  through the [facility]. This social worker contacted nurse on call Ebony Guzman [phone] to confirm that the [facility] program would be able to provide [facility] RN, PT and OT services for patient.  Phone call to patient's niece Ebony Guzman to inform her of discharge. Per patient's niece, she would like patient to discharge home by EMS.  [place], LCSW Clinical Social Work [phone]          Expected Discharge Plan and Services                                                 Social Determinants of Health (SDOH) Interventions    Readmission Risk Interventions No flowsheet data found.

## 2019-04-05 NOTE — Discharge Summary (Signed)
Physician Discharge Summary  Ebony Guzman ZOX:[id number] DOB: 1951/02/20 DOA: 04/03/2019  PCP: Delton Prairie, FNP  Admit date: 04/03/2019 Discharge date: 04/05/2019  Admitted From: home Disposition:  Home w/ home health  Recommendations for Outpatient Follow-up:  1. Follow up with PCP in 2 weeks; F/U w/ ortho surg in 2 weeks 2. Quarantine at home for 12 days more   Home Health: yes Equipment/Devices: no   Discharge Condition: stable  CODE STATUS: full  Diet recommendation:   Carb Modified  Brief/Interim Summary: HPI taken  from Dr. Maryfrances Bunnell: Ebony Guzman is a 68 y.o. F with hx HTN, DM and remote TBI in [facility] age 73 who presents with fall and leg pain.  Patient was in [facility] until Monday 5 days ago, developed coryza.  Tested positive for COVID, and was home self-monitoring until this morning, she was walking and had mechanical fall.  After the fall, she was unable to bear weight and had severe pain in the right groin and right leg.  Two people could not lift her to stand.  In the ER, febrile to 101F, HR and RR and SpO2 normal.  CXR showed patchy multifocal pneumonia.  Hip radiograph normal but CT pelvis showed faint inferior pubic ramus fracture, consistent with pain.  Orthopedics recommended pain control and WBAT.    Hospital course from Dr. Wilfred Lacy: Patient presented from home after mechanical fall and and was unable to bear weight and pain in the right groin and right leg.  CT scan showed hairline fracture of the right pubic ramus.  Orthopedic surgery saw the patient and recommended pain control and therapy evaluation.  Patient was admitted for pain control and to be evaluated by therapy.  PT OT saw the patient and recommended SNF but patient is part of the [facility] program which is only contracted with 2 SNFs & they are currently not taking COVID-19 positive patients.  Pt will be discharged home with home health PT/OT. Of note, patient recently tested positive for  COVID-19.  Patient did not require supplemental oxygen while in the hospital.   Discharge Diagnoses:  Principal Problem:   Pelvic fracture (HCC) Active Problems:   COVID-19 virus infection   Essential hypertension   Type 2 diabetes mellitus without complication, without long-term current use of insulin (HCC)   TBI (traumatic brain injury) (HCC)   COVID-19  Public insufficiency fracture: Continue on acetaminophen and oxycodone as needed for pain.  PT eval pending  COVID-19 pneumonitis without hypoxic respiratory failure: will continue on zinc, vitamin c and will start steroids & remdesivir. Tylenol prn for fevers.  Airborne and contact isolation precautions. Encourage incentive spirometry  Hypertension: continue home dose of amlodipine and atenolol   DM2: Likely diet controlled as patient takes no DM meds at home. Will continue SSI w/ accuchecks while on steroids  Transaminitis: ALT is WNL, AST is elevated and trending up slightly today. Will continue to monitor   Remote TBI: Continue supportive care  Discharge Instructions  Discharge Instructions    Diet - low sodium heart healthy   Complete by: As directed    Diet Carb Modified   Complete by: As directed    Discharge instructions   Complete by: As directed    F/u w/ PCP in 2 weeks; F/u w/ ortho surg in 2 weeks (Dr. Deeann Saint); quarantine for 12 days more   Increase activity slowly   Complete by: As directed      Allergies as of 04/05/2019   No  Known Allergies     Medication List    TAKE these medications   acetaminophen 500 MG tablet Commonly known as: TYLENOL Take 2 tablets (1,000 mg total) by mouth 3 (three) times daily.   amLODipine 5 MG tablet Commonly known as: NORVASC Take 5 mg by mouth daily.   aspirin 81 MG chewable tablet Chew 81 mg by mouth daily.   atenolol 25 MG tablet Commonly known as: TENORMIN Take 10 mg by mouth daily.   cholecalciferol 25 MCG (1000 UT) tablet Commonly known as:  VITAMIN D3 Take 1,000 Units by mouth daily.   citalopram 20 MG tablet Commonly known as: CELEXA Take 20 mg by mouth daily.   dexamethasone 6 MG tablet Commonly known as: DECADRON Take 1 tablet (6 mg total) by mouth daily for 5 days.   docusate sodium 100 MG capsule Commonly known as: COLACE Take 100 mg by mouth daily.   lisinopril 40 MG tablet Commonly known as: ZESTRIL Take 40 mg by mouth daily.   MULTIVITAMIN ADULT PO Take 1 tablet by mouth daily.   rosuvastatin 10 MG tablet Commonly known as: CRESTOR Take 10 mg by mouth at bedtime.      Follow-up Information    Earnestine Leys, MD. Schedule an appointment as soon as possible for a visit in 2 week(s).   Specialty: Orthopedic Surgery Contact information: [place] [address] [phone]          No Known Allergies  Consultations:  n/a   Procedures/Studies: Ct Head Wo Contrast  Result Date: 04/03/2019 CLINICAL DATA:  Unwitnessed trauma this morning status post fall. EXAM: CT HEAD WITHOUT CONTRAST CT CERVICAL SPINE WITHOUT CONTRAST TECHNIQUE: Multidetector CT imaging of the head and cervical spine was performed following the standard protocol without intravenous contrast. Multiplanar CT image reconstructions of the cervical spine were also generated. COMPARISON:  Head CT April 30, 2016 FINDINGS: CT HEAD FINDINGS Brain: No acute territory infarction or intracranial hemorrhage is visualized. No focal mass lesion is identified. Asymmetric left greater than right lateral ex vacuole ventricular enlargement is similar to prior exam. Left hemispheric encephalomalacia and white matter volume loss is similar to prior exam. Stable small old lacunar infarcts are identified in the right periventricular white matter unchanged. Vascular: No hyperdense vessel is noted. Skull: No acute abnormality. Sinuses/Orbits: No acute finding. Other: None. CT CERVICAL SPINE FINDINGS Alignment: Normal. Skull base and  vertebrae: No acute fracture. No focal pathologic process is noted. Soft tissues and spinal canal: No prevertebral fluid or swelling. No visible canal hematoma. Disc levels: Extensive anterior osteophytosis is identified throughout the cervical spine. The intervertebral spaces are slightly diminished. Upper chest: Visualized lung apices are clear. Mild generalized enlargement of the thyroid gland is noted. Other: None. IMPRESSION: 1. No focal acute intracranial abnormality identified. 2. No acute fracture or dislocation of cervical spine. Electronically Signed   By: Abelardo Diesel M.D.   On: 04/03/2019 10:20   Ct Cervical Spine Wo Contrast  Result Date: 04/03/2019 CLINICAL DATA:  Unwitnessed trauma this morning status post fall. EXAM: CT HEAD WITHOUT CONTRAST CT CERVICAL SPINE WITHOUT CONTRAST TECHNIQUE: Multidetector CT imaging of the head and cervical spine was performed following the standard protocol without intravenous contrast. Multiplanar CT image reconstructions of the cervical spine were also generated. COMPARISON:  Head CT April 30, 2016 FINDINGS: CT HEAD FINDINGS Brain: No acute territory infarction or intracranial hemorrhage is visualized. No focal mass lesion is identified. Asymmetric left greater than right lateral ex vacuole ventricular  enlargement is similar to prior exam. Left hemispheric encephalomalacia and white matter volume loss is similar to prior exam. Stable small old lacunar infarcts are identified in the right periventricular white matter unchanged. Vascular: No hyperdense vessel is noted. Skull: No acute abnormality. Sinuses/Orbits: No acute finding. Other: None. CT CERVICAL SPINE FINDINGS Alignment: Normal. Skull base and vertebrae: No acute fracture. No focal pathologic process is noted. Soft tissues and spinal canal: No prevertebral fluid or swelling. No visible canal hematoma. Disc levels: Extensive anterior osteophytosis is identified throughout the cervical spine. The  intervertebral spaces are slightly diminished. Upper chest: Visualized lung apices are clear. Mild generalized enlargement of the thyroid gland is noted. Other: None. IMPRESSION: 1. No focal acute intracranial abnormality identified. 2. No acute fracture or dislocation of cervical spine. Electronically Signed   By: Sherian ReinWei-Chen  Lin M.D.   On: 04/03/2019 10:20   Ct Pelvis Wo Contrast  Result Date: 04/03/2019 CLINICAL DATA:  Right hip pain after fall EXAM: CT PELVIS WITHOUT CONTRAST TECHNIQUE: Multidetector CT imaging of the pelvis was performed following the standard protocol without intravenous contrast. COMPARISON:  x-ray 04/03/2019 FINDINGS: Bones/Joint/Cartilage Very subtle cortical lucency of the medial aspect of the right inferior pubic ramus (series 2, image 91) which could represent a prominent vascular channel versus a nondisplaced fracture. The osseous structures appear otherwise intact. No additional fractures. SI joints and pubic symphysis are intact without diastasis. Bilateral hips are intact without fracture or dislocation. Right greater than left facet arthropathy noted at the L4-5 level. Assimilation joint on the right at L5-S1. No demonstrable hip joint effusion. Ligaments Suboptimally evaluated by CT. Muscles and Tendons Tendinous structures about the pelvis appear grossly intact. No focal muscular abnormality. No right adductor muscle group hematoma. Soft tissues No soft tissue fluid collection or hematoma. No acute intrapelvic abnormality. IMPRESSION: 1. Very subtle cortical lucency of the medial aspect of the right inferior pubic ramus which could represent a prominent vascular channel versus a nondisplaced fracture. The osseous structures appear otherwise intact. 2. No acute findings within the pelvis or surrounding soft tissues. Electronically Signed   By: Duanne GuessNicholas  Plundo M.D.   On: 04/03/2019 14:46   Dg Chest Portable 1 View  Result Date: 04/03/2019 CLINICAL DATA:  COVID-19 positive.   Fall this morning. EXAM: PORTABLE CHEST 1 VIEW COMPARISON:  10/31/2013 chest radiograph. FINDINGS: Stable cardiomediastinal silhouette with normal heart size. No pneumothorax. No pleural effusion. Patchy hazy opacities in the peripheral upper and lower right lung, new. No displaced fractures in the visualized chest. IMPRESSION: New patchy hazy opacities in the peripheral upper lower right lung compatible with multilobar pneumonia. Chest radiograph follow-up advised. Electronically Signed   By: Delbert PhenixJason A Poff M.D.   On: 04/03/2019 10:21   Dg Hip Unilat  With Pelvis 2-3 Views Right  Result Date: 04/03/2019 CLINICAL DATA:  Larey SeatFell this morning.  Right hip pain. EXAM: DG HIP (WITH OR WITHOUT PELVIS) 2-3V RIGHT COMPARISON:  None. FINDINGS: Both hips are normally located. No acute hip fracture. The pubic symphysis and SI joints are intact. Suspect right-sided pubic rami fractures. No other pelvic bone fractures. IMPRESSION: No acute right hip fracture. Suspect right-sided pubic bone fractures. Electronically Signed   By: Rudie MeyerP.  Gallerani M.D.   On: 04/03/2019 09:40      Subjective: Pt c/o fatigue  Discharge Exam: Vitals:   04/05/19 0409 04/05/19 0748  BP: 131/74 140/67  Pulse: 67 61  Resp: 20 19  Temp: 97.9 F (36.6 C) 97.9 F (36.6 C)  SpO2: 98%  100%   Vitals:   04/04/19 1715 04/04/19 1718 04/05/19 0409 04/05/19 0748  BP:  (!) 115/59 131/74 140/67  Pulse:  81 67 61  Resp:  19 20 19   Temp:  98.5 F (36.9 C) 97.9 F (36.6 C) 97.9 F (36.6 C)  TempSrc:  Oral Oral   SpO2:  96% 98% 100%  Weight: 86 kg     Height: 5\' 4"  (1.626 m)       General: Pt is alert, awake, not in acute distress Cardiovascular: S1/S2 normal, no rubs, no gallops Respiratory: diminished breath sounds b/l otherwise clear, no wheezing, no rhonchi Abdominal: Soft, NT, ND, bowel sounds + Extremities: no edema, no cyanosis    The results of significant diagnostics from this hospitalization (including imaging, microbiology,  ancillary and laboratory) are listed below for reference.     Microbiology: Recent Results (from the past 240 hour(s))  SARS CORONAVIRUS 2 (TAT 6-24 HRS) Nasopharyngeal Nasopharyngeal Swab     Status: Abnormal   Collection Time: 04/03/19 12:44 PM   Specimen: Nasopharyngeal Swab  Result Value Ref Range Status   SARS Coronavirus 2 POSITIVE (A) NEGATIVE Final    Comment: RESULT CALLED TO, READ BACK BY AND VERIFIED WITH: K [id number] 04/04/2019 T. TYSOR (NOTE) SARS-CoV-2 target nucleic acids are DETECTED. The SARS-CoV-2 RNA is generally detectable in upper and lower respiratory specimens during the acute phase of infection. Positive results are indicative of the presence of SARS-CoV-2 RNA. Clinical correlation with patient history and other diagnostic information is  necessary to determine patient infection status. Positive results do not rule out bacterial infection or co-infection with other viruses.  The expected result is Negative. Fact Sheet for Patients: [place] Calender Fact Sheet for Healthcare Providers: 14/08/2018 This test is not yet approved or cleared by the [place] FDA and  has been authorized for detection and/or diagnosis of SARS-CoV-2 by FDA under an Emergency Use Authorization (EUA). This EUA will remain  in effect (meaning this test can be used) for t he duration of the COVID-19 declaration under Section 564(b)(1) of the Act, 21 U.S.C. section 360bbb-3(b)(1), unless the authorization is terminated or revoked sooner. Performed at [facility], [address], [place], [address] [place]      Labs: BNP (last 3 results) No results for input(s): BNP in the last 8760 hours. Basic Metabolic Panel: Recent Labs  Lab 04/03/19 1032 04/05/19 0604  NA 136 139  K 3.8 3.5  CL 102 104  CO2 24 27  GLUCOSE 137* 137*  BUN 12 17  CREATININE 0.80 0.66  CALCIUM 8.6* 9.0   Liver Function  Tests: Recent Labs  Lab 04/03/19 1032 04/05/19 0604  AST 76* 92*  ALT 28 33  ALKPHOS 36* 30*  BILITOT 0.4 0.6  PROT 7.2 7.1  ALBUMIN 3.3* 3.0*   No results for input(s): LIPASE, AMYLASE in the last 168 hours. No results for input(s): AMMONIA in the last 168 hours. CBC: Recent Labs  Lab 04/03/19 1032 04/05/19 0604  WBC 6.6 7.5  NEUTROABS 5.1  --   HGB 11.9* 12.3  HCT 35.4* 36.4  MCV 86.8 87.1  PLT 193 244   Cardiac Enzymes: No results for input(s): CKTOTAL, CKMB, CKMBINDEX, TROPONINI in the last 168 hours. BNP: Invalid input(s): POCBNP CBG: Recent Labs  Lab 04/04/19 1247 04/04/19 2014 04/05/19 0749 04/05/19 1149  GLUCAP 158* 187* 102* 173*   D-Dimer No results for input(s): DDIMER in the last 72 hours. Hgb A1c No results for input(s): HGBA1C  in the last 72 hours. Lipid Profile No results for input(s): CHOL, HDL, LDLCALC, TRIG, CHOLHDL, LDLDIRECT in the last 72 hours. Thyroid function studies No results for input(s): TSH, T4TOTAL, T3FREE, THYROIDAB in the last 72 hours.  Invalid input(s): FREET3 Anemia work up No results for input(s): VITAMINB12, FOLATE, FERRITIN, TIBC, IRON, RETICCTPCT in the last 72 hours. Urinalysis    Component Value Date/Time   COLORURINE YELLOW (A) 04/03/2019 1244   APPEARANCEUR CLEAR (A) 04/03/2019 1244   LABSPEC 1.018 04/03/2019 1244   PHURINE 6.0 04/03/2019 1244   GLUCOSEU NEGATIVE 04/03/2019 1244   HGBUR NEGATIVE 04/03/2019 1244   BILIRUBINUR NEGATIVE 04/03/2019 1244   KETONESUR 5 (A) 04/03/2019 1244   PROTEINUR 100 (A) 04/03/2019 1244   NITRITE NEGATIVE 04/03/2019 1244   LEUKOCYTESUR NEGATIVE 04/03/2019 1244   Sepsis Labs Invalid input(s): PROCALCITONIN,  WBC,  LACTICIDVEN Microbiology Recent Results (from the past 240 hour(s))  SARS CORONAVIRUS 2 (TAT 6-24 HRS) Nasopharyngeal Nasopharyngeal Swab     Status: Abnormal   Collection Time: 04/03/19 12:44 PM   Specimen: Nasopharyngeal Swab  Result Value Ref Range Status    SARS Coronavirus 2 POSITIVE (A) NEGATIVE Final    Comment: RESULT CALLED TO, READ BACK BY AND VERIFIED WITH: [place] Calender [id number] 04/04/2019 T. TYSOR (NOTE) SARS-CoV-2 target nucleic acids are DETECTED. The SARS-CoV-2 RNA is generally detectable in upper and lower respiratory specimens during the acute phase of infection. Positive results are indicative of the presence of SARS-CoV-2 RNA. Clinical correlation with patient history and other diagnostic information is  necessary to determine patient infection status. Positive results do not rule out bacterial infection or co-infection with other viruses.  The expected result is Negative. Fact Sheet for Patients: [employer] Fact Sheet for Healthcare Providers: quierodirigir.com This test is not yet approved or cleared by the [place] FDA and  has been authorized for detection and/or diagnosis of SARS-CoV-2 by FDA under an Emergency Use Authorization (EUA). This EUA will remain  in effect (meaning this test can be used) for t he duration of the COVID-19 declaration under Section 564(b)(1) of the Act, 21 U.S.C. section 360bbb-3(b)(1), unless the authorization is terminated or revoked sooner. Performed at [facility], [address] [place], [place] [address]      Time coordinating discharge: Over 30 minutes  SIGNED:   Charise Killian, MD  [facility] 04/05/2019, 1:53 PM Pager   If 7PM-7AM, please contact night-coverage [email or web address] Password [email or web address]

## 2019-04-05 NOTE — Progress Notes (Signed)
Patient being discharged home with the [facility] that will provide home health and therapy. RN notified patients niece that she was picked up by EMS and is on the way home.

## 2019-04-06 LAB — GLUCOSE, CAPILLARY: Glucose-Capillary: 142 mg/dL — ABNORMAL HIGH (ref 70–99)

## 2021-06-02 IMAGING — CT CT CERVICAL SPINE W/O CM
4 of 7 series · 13 of 33 positions shown, 15 images · non-contrast
Comparison: Head CT April 30, 2016

CLINICAL DATA: Unwitnessed trauma this morning status post fall.

EXAM:
CT HEAD WITHOUT CONTRAST
CT CERVICAL SPINE WITHOUT CONTRAST
TECHNIQUE: Multidetector CT imaging of the head and cervical spine was
performed following the standard protocol without intravenous
contrast. Multiplanar CT image reconstructions of the cervical spine
were also generated.

[Series 509: c spine soft · axial · 0.25mm/px · z∈[-268,-228]mm · 2 of 80 slices shown]
[im 20/80  soft-tissue]
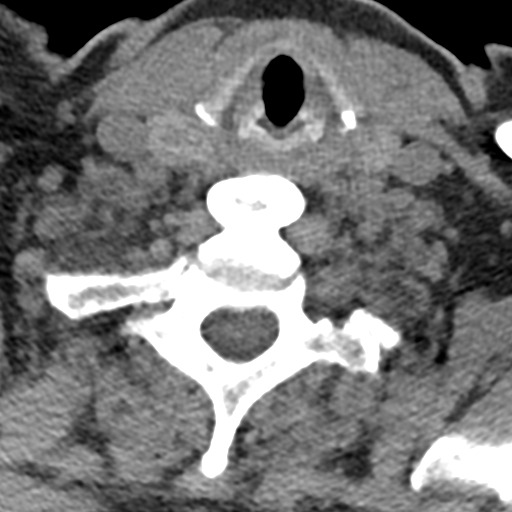
[im 40/80  soft-tissue]
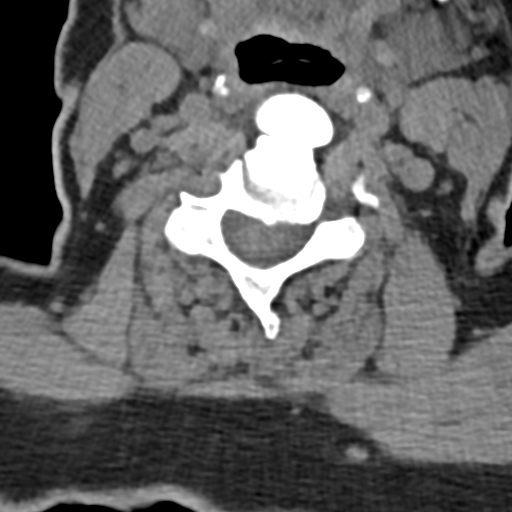

[Series 510: sagittal bone · sagittal · 0.28mm/px · 5 of 60 slices shown]
[im 10/60  bone]
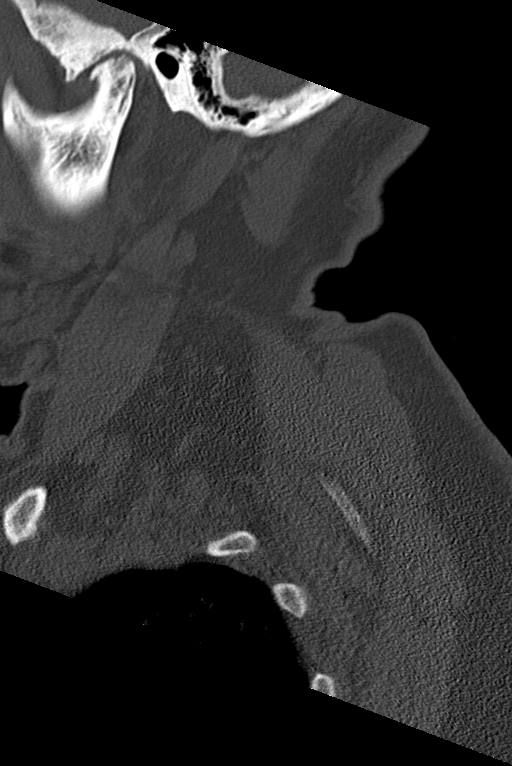
[im 20/60  bone]
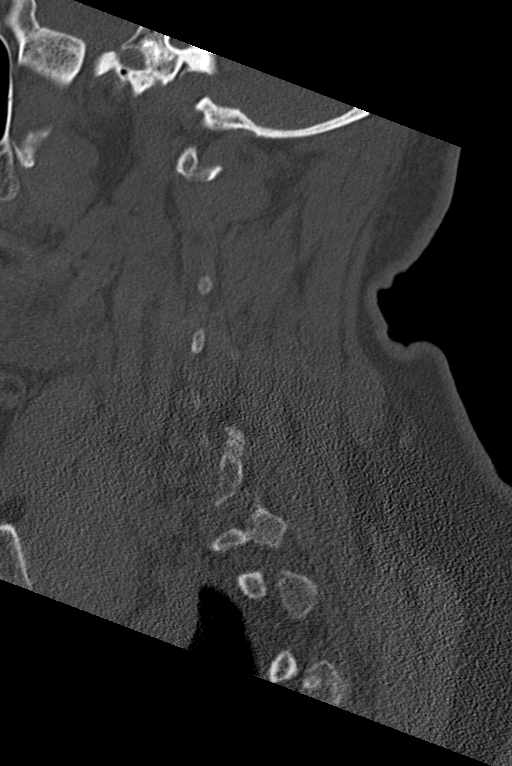
[im 30/60  bone]
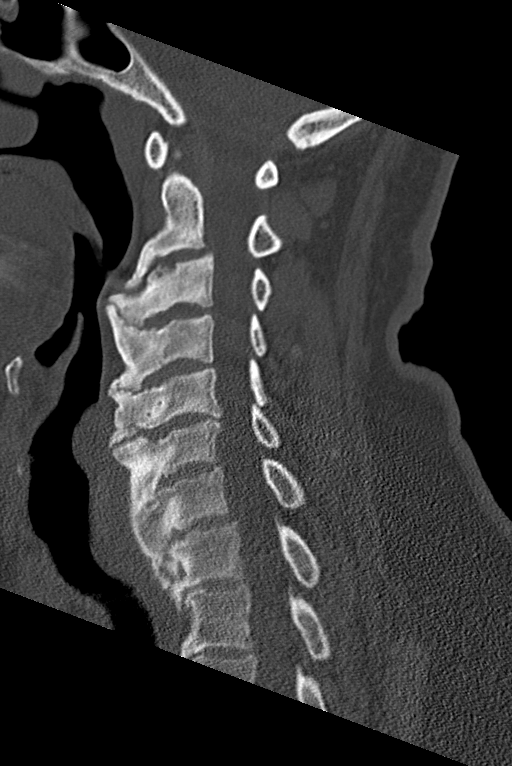
[im 40/60  bone]
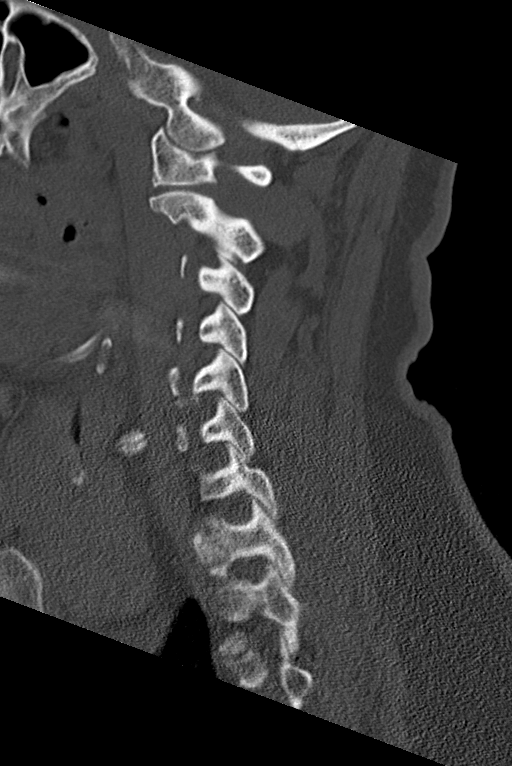
[im 50/60  bone]
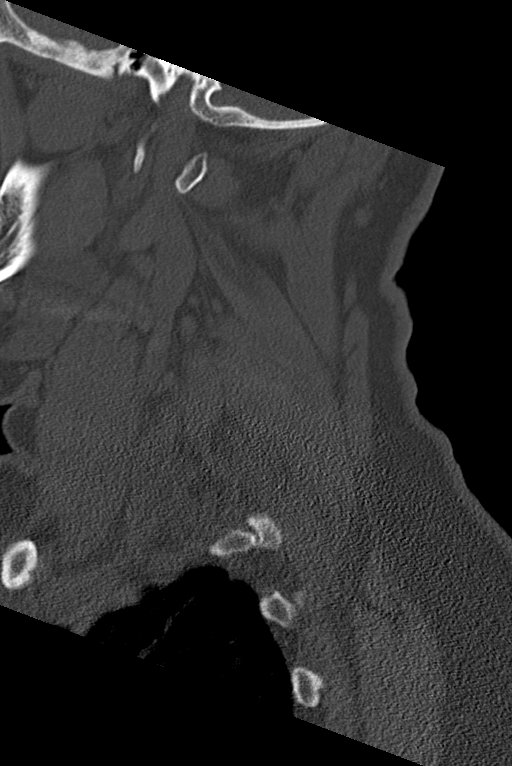

[Series 511: coronal bone · coronal · 0.26mm/px · 1 of 62 slices shown]
[im 31/62  bone]
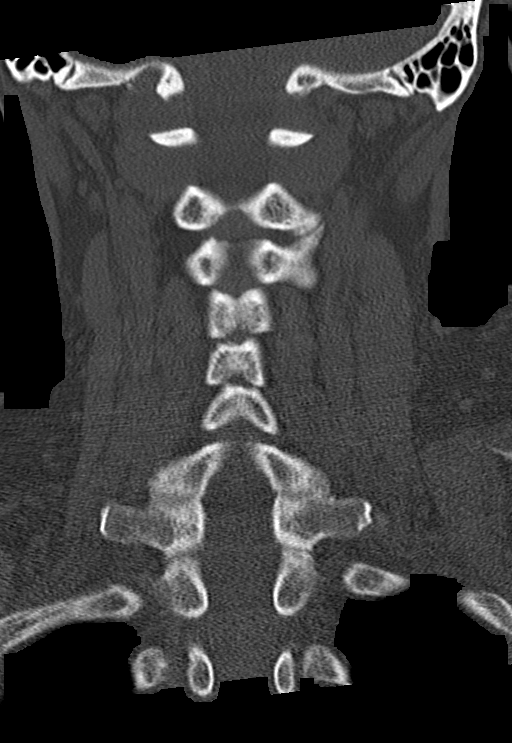

[Series 512: orthogonal bone · axial · 0.28mm/px · z∈[-306,-189]mm · 5 of 96 slices shown, 7 images]
[im 16/96  soft-tissue]
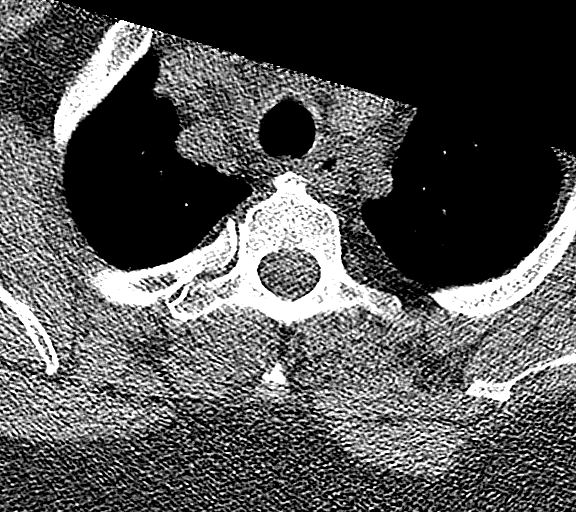
[im 16/96  bone]
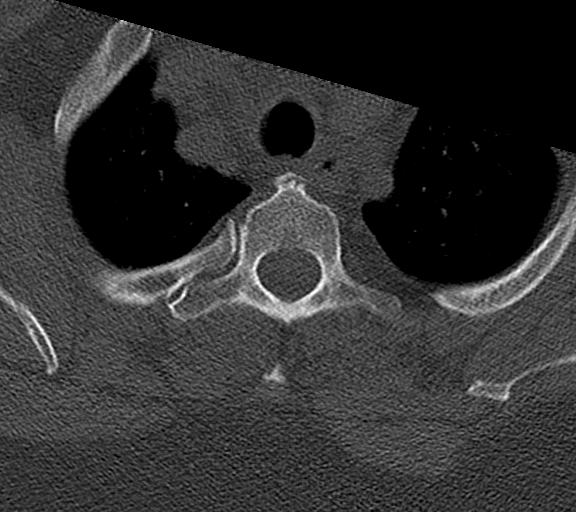
[im 32/96  bone]
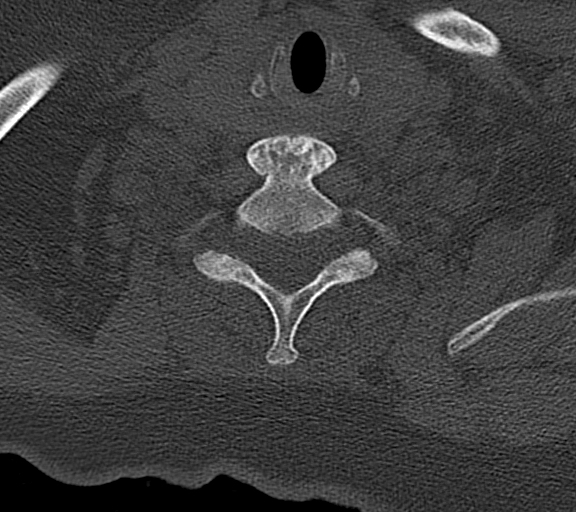
[im 48/96  bone]
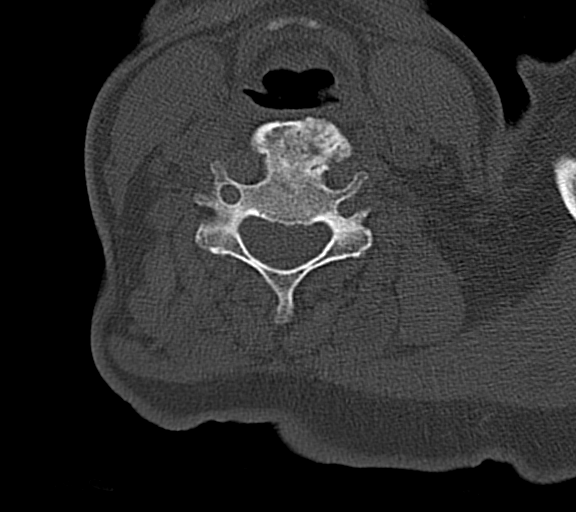
[im 64/96  bone]
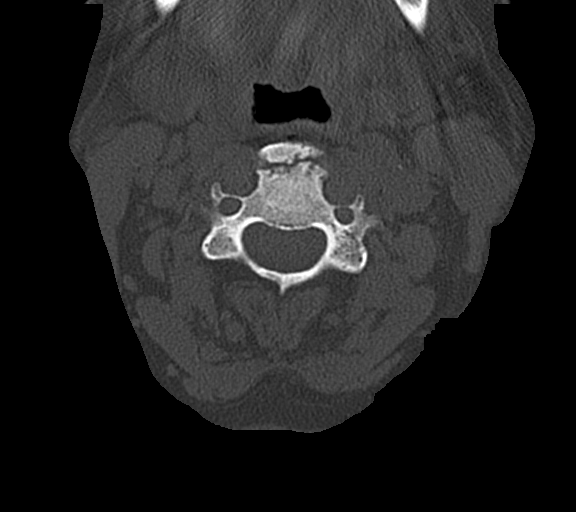
[im 80/96  soft-tissue]
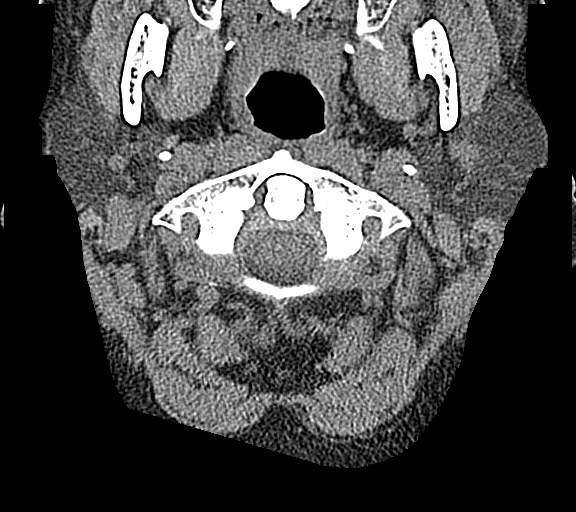
[im 80/96  bone]
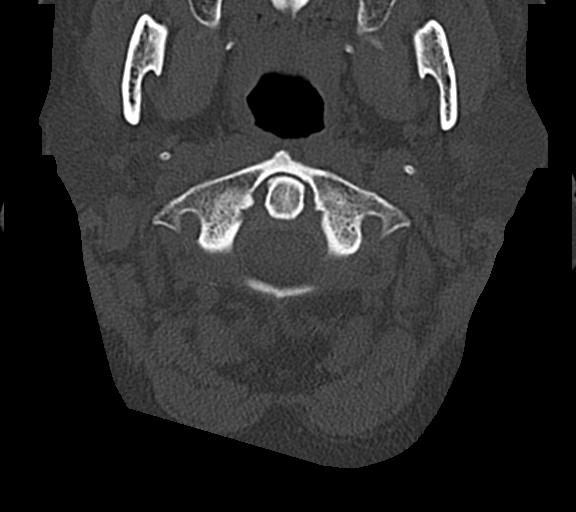

[13 of 33 positions shown; findings below may reference images not displayed]

FINDINGS: CT HEAD FINDINGS

Brain: No acute territory infarction or intracranial hemorrhage is
visualized. No focal mass lesion is identified. Asymmetric left
greater than right lateral ex vacuole ventricular enlargement is
similar to prior exam. Left hemispheric encephalomalacia and white
matter volume loss is similar to prior exam. Stable small old
lacunar infarcts are identified in the right periventricular white
matter unchanged.

Vascular: No hyperdense vessel is noted.

Skull: No acute abnormality.

Sinuses/Orbits: No acute finding.

Other: None.

CT CERVICAL SPINE FINDINGS

Alignment: Normal.

Skull base and vertebrae: No acute fracture. No focal pathologic
process is noted.

Soft tissues and spinal canal: No prevertebral fluid or swelling. No
visible canal hematoma.

Disc levels: Extensive anterior osteophytosis is identified
throughout the cervical spine. The intervertebral spaces are
slightly diminished.

Upper chest: Visualized lung apices are clear. Mild generalized
enlargement of the thyroid gland is noted.

Other: None.
IMPRESSION: 1. No focal acute intracranial abnormality identified.
2. No acute fracture or dislocation of cervical spine.

## 2021-06-02 IMAGING — CT CT PELVIS W/O CM
2 of 3 series · 16 of 46 positions shown, 18 images · non-contrast
Comparison: x-ray 04/03/2019

CLINICAL DATA: Right hip pain after fall

EXAM:
CT PELVIS WITHOUT CONTRAST
TECHNIQUE: Multidetector CT imaging of the pelvis was performed following the
standard protocol without intravenous contrast.

[Series 3: axial st · axial · 0.74mm/px · z∈[-871,-657]mm · 13 of 123 slices shown, 15 images]
[im 8/123  soft-tissue]
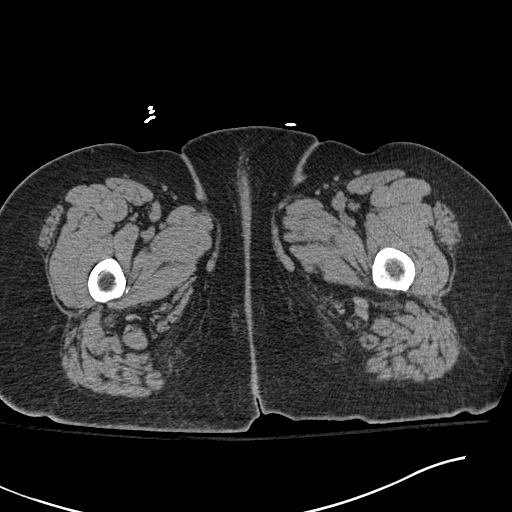
[im 8/123  bone]
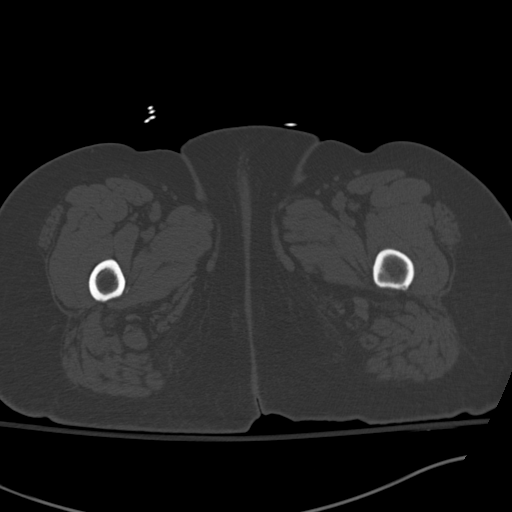
[im 16/123  soft-tissue]
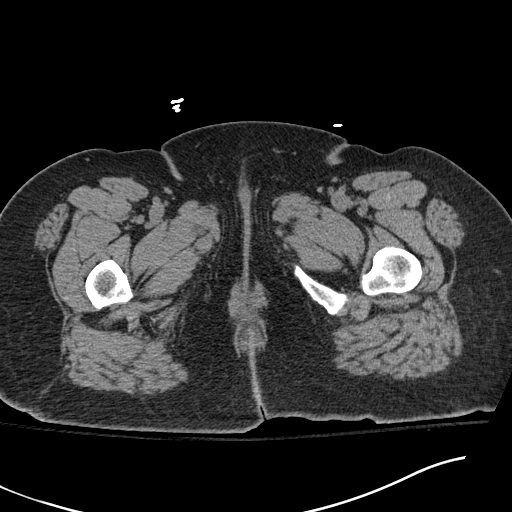
[im 24/123  soft-tissue]
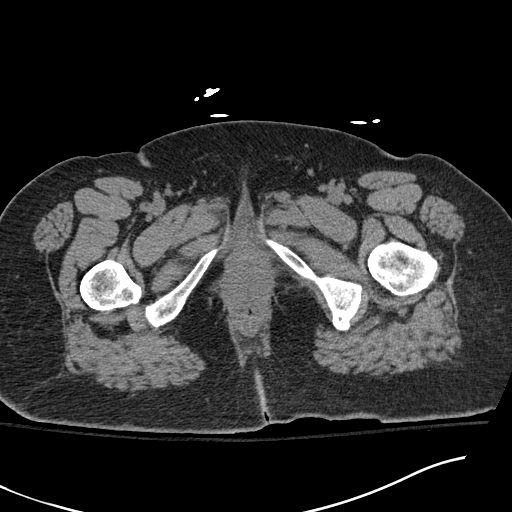
[im 36/123  soft-tissue]
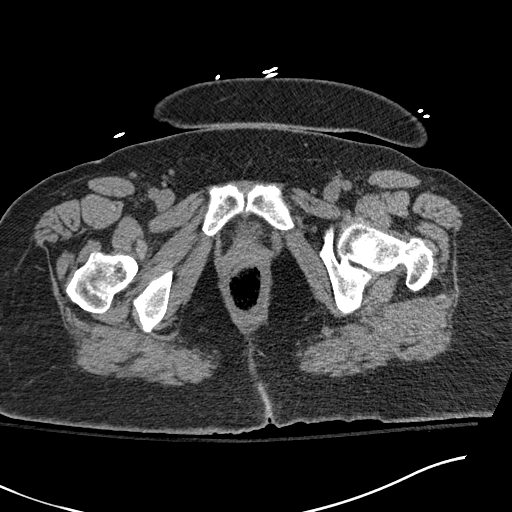
[im 44/123  soft-tissue]
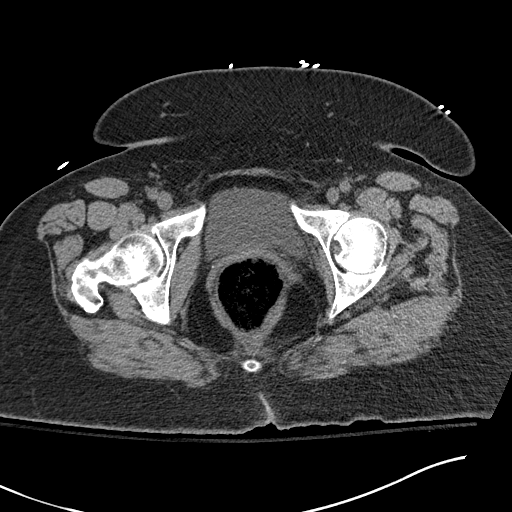
[im 52/123  soft-tissue]
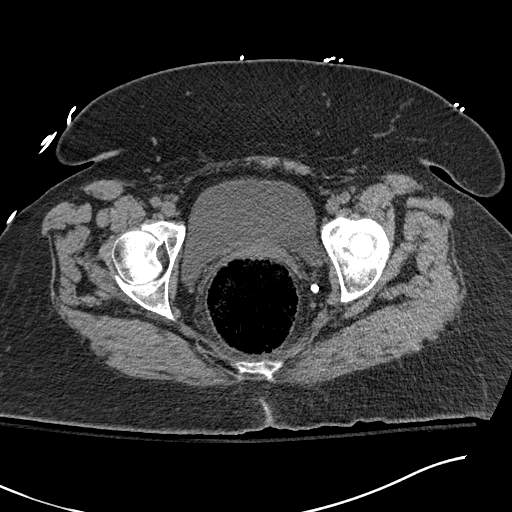
[im 63/123  soft-tissue]
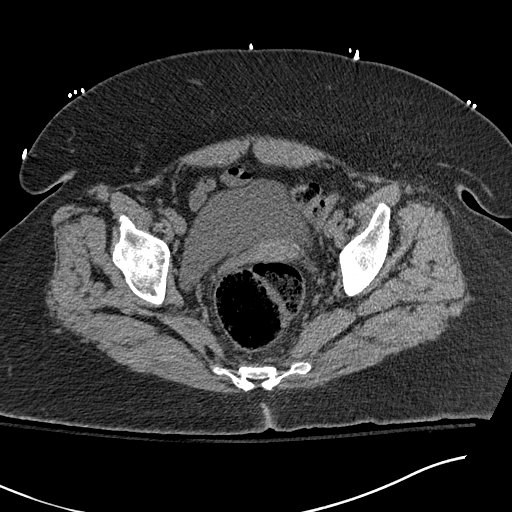
[im 71/123  soft-tissue]
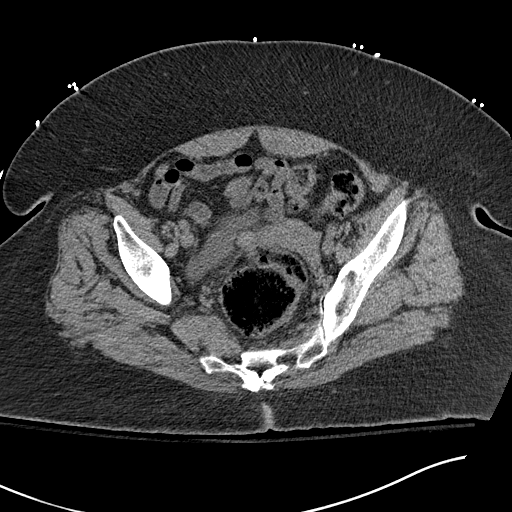
[im 79/123  soft-tissue]
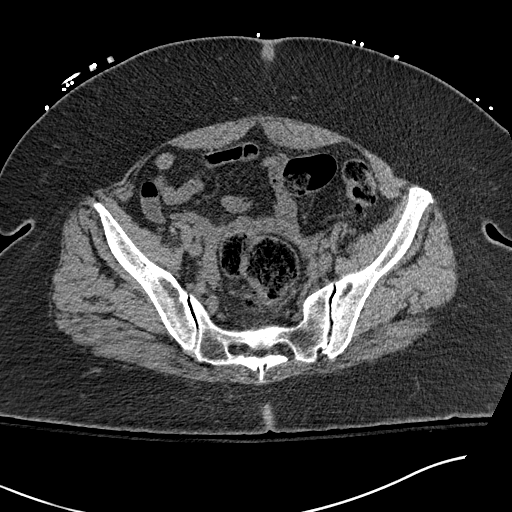
[im 79/123  bone]
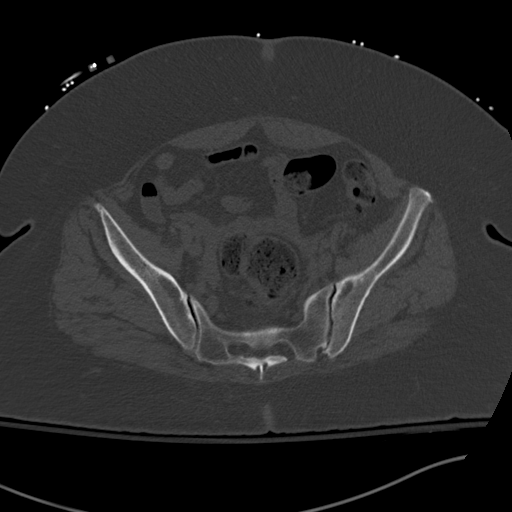
[im 87/123  soft-tissue]
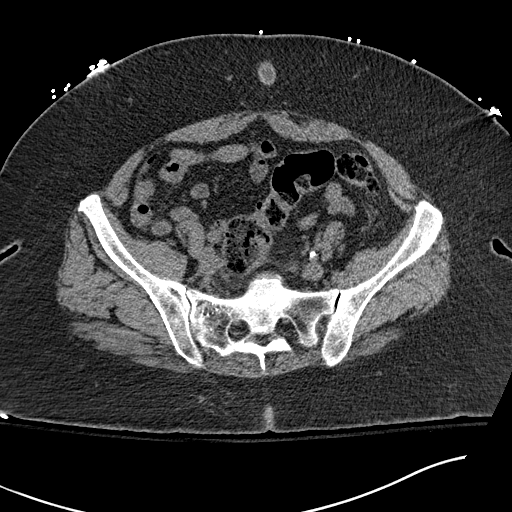
[im 99/123  soft-tissue]
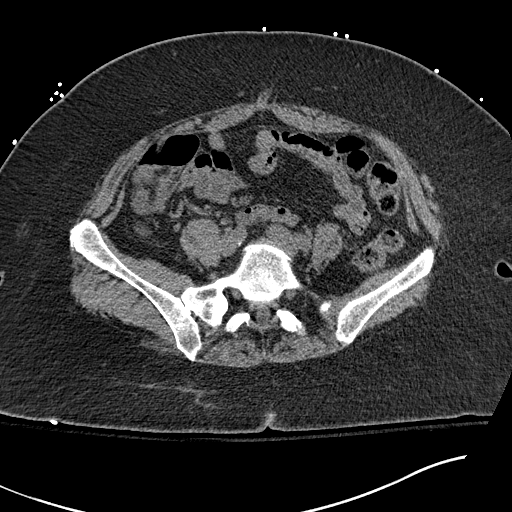
[im 107/123  soft-tissue]
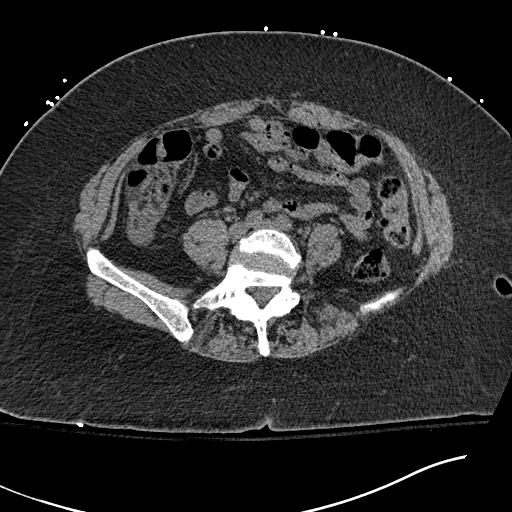
[im 115/123  soft-tissue]
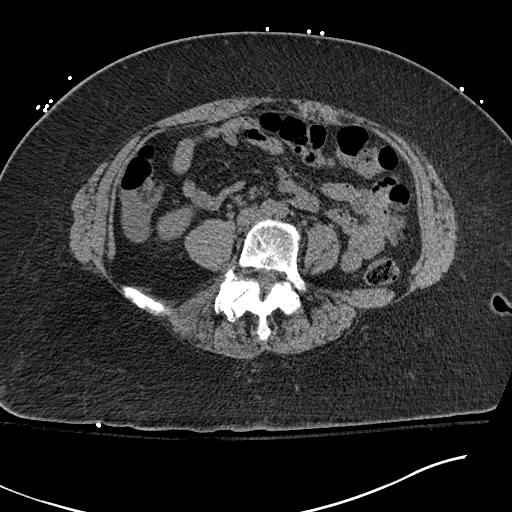

[Series 8: coronal st · coronal · 0.52mm/px · 3 of 145 slices shown]
[im 49/145  soft-tissue]
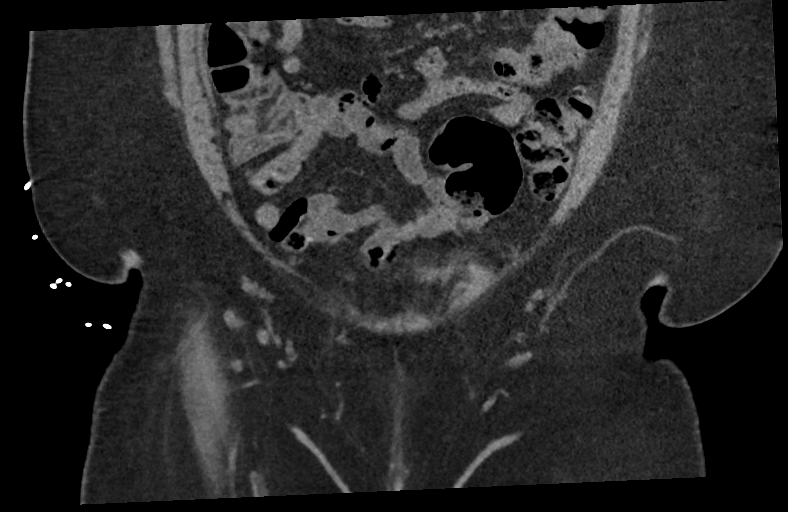
[im 65/145  soft-tissue]
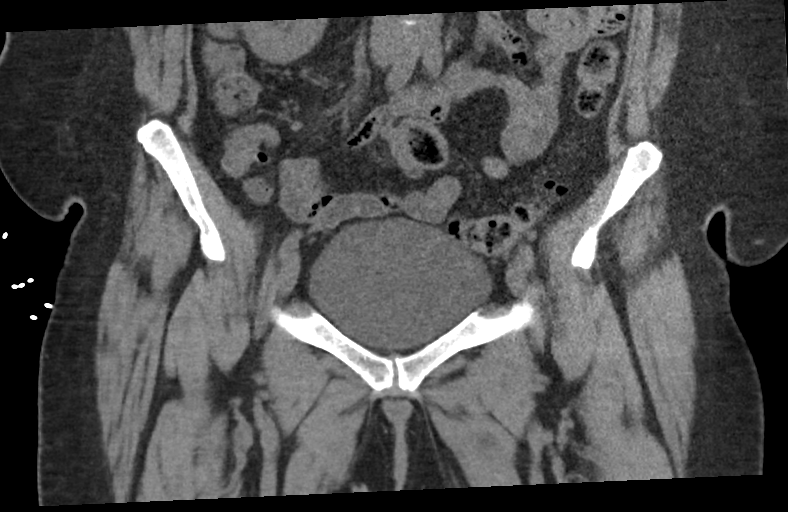
[im 81/145  soft-tissue]
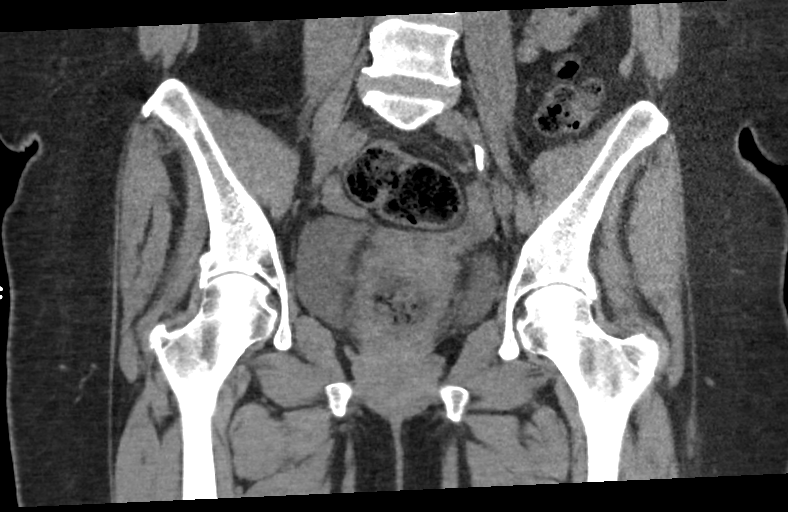

[16 of 46 positions shown; findings below may reference images not displayed]

FINDINGS: Bones/Joint/Cartilage

Very subtle cortical lucency of the medial aspect of the right
inferior pubic ramus (series 2, image 91) which could represent a
prominent vascular channel versus a nondisplaced fracture. The
osseous structures appear otherwise intact. No additional fractures.
SI joints and pubic symphysis are intact without diastasis.
Bilateral hips are intact without fracture or dislocation. Right
greater than left facet arthropathy noted at the L4-5 level.
Assimilation joint on the right at L5-S1.

No demonstrable hip joint effusion.

Ligaments

Suboptimally evaluated by CT.

Muscles and Tendons

Tendinous structures about the pelvis appear grossly intact. No
focal muscular abnormality. No right adductor muscle group hematoma.

Soft tissues

No soft tissue fluid collection or hematoma. No acute intrapelvic
abnormality.
IMPRESSION: 1. Very subtle cortical lucency of the medial aspect of the right
inferior pubic ramus which could represent a prominent vascular
channel versus a nondisplaced fracture. The osseous structures
appear otherwise intact.
2. No acute findings within the pelvis or surrounding soft tissues.

## 2021-10-25 ENCOUNTER — Other Ambulatory Visit: Payer: Self-pay | Admitting: Family Medicine

## 2021-10-25 DIAGNOSIS — Z1231 Encounter for screening mammogram for malignant neoplasm of breast: Secondary | ICD-10-CM

## 2021-11-17 ENCOUNTER — Ambulatory Visit
Admission: RE | Admit: 2021-11-17 | Discharge: 2021-11-17 | Disposition: A | Discharge status: Home / Self Care | Payer: Medicare (Managed Care) | Source: Ambulatory Visit | Attending: Family Medicine | Admitting: Family Medicine

## 2021-11-17 DIAGNOSIS — Z1231 Encounter for screening mammogram for malignant neoplasm of breast: Secondary | ICD-10-CM | POA: Insufficient documentation
